# Patient Record
Sex: Female | Born: 1975 | Race: Black or African American | Hispanic: No | Marital: Single | Smoking: Never smoker
Health system: Southern US, Community
[De-identification: ages and names within clinical notes are randomized; demographics above are authoritative.]

## PROBLEM LIST (undated history)

## (undated) DIAGNOSIS — N3289 Other specified disorders of bladder: Secondary | ICD-10-CM

## (undated) DIAGNOSIS — N200 Calculus of kidney: Secondary | ICD-10-CM

## (undated) HISTORY — PX: OTHER SURGICAL HISTORY: SHX169

## (undated) HISTORY — PX: ANKLE SURGERY: SHX546

## (undated) HISTORY — PX: OVARIAN CYST REMOVAL: SHX89

## (undated) HISTORY — DX: Other specified disorders of bladder: N32.89

## (undated) HISTORY — DX: Calculus of kidney: N20.0

---

## 1998-04-29 ENCOUNTER — Encounter: Admission: RE | Admit: 1998-04-29 | Discharge: 1998-04-29 | Payer: Self-pay | Admitting: *Deleted

## 1998-12-27 ENCOUNTER — Ambulatory Visit (HOSPITAL_COMMUNITY): Admission: RE | Admit: 1998-12-27 | Discharge: 1998-12-27 | Payer: Self-pay | Admitting: Urology

## 2000-01-11 ENCOUNTER — Other Ambulatory Visit: Admission: RE | Admit: 2000-01-11 | Discharge: 2000-01-11 | Payer: Self-pay | Admitting: *Deleted

## 2000-08-15 ENCOUNTER — Inpatient Hospital Stay (HOSPITAL_COMMUNITY): Admission: AD | Admit: 2000-08-15 | Discharge: 2000-08-17 | Payer: Self-pay | Admitting: Obstetrics and Gynecology

## 2000-11-29 ENCOUNTER — Emergency Department (HOSPITAL_COMMUNITY): Admission: EM | Admit: 2000-11-29 | Discharge: 2000-11-29 | Payer: Self-pay | Admitting: Emergency Medicine

## 2001-04-22 ENCOUNTER — Emergency Department (HOSPITAL_COMMUNITY): Admission: EM | Admit: 2001-04-22 | Discharge: 2001-04-22 | Payer: Self-pay | Admitting: *Deleted

## 2001-05-03 ENCOUNTER — Emergency Department (HOSPITAL_COMMUNITY): Admission: EM | Admit: 2001-05-03 | Discharge: 2001-05-03 | Payer: Self-pay | Admitting: Emergency Medicine

## 2001-10-25 ENCOUNTER — Encounter: Payer: Self-pay | Admitting: Emergency Medicine

## 2001-10-25 ENCOUNTER — Emergency Department (HOSPITAL_COMMUNITY): Admission: EM | Admit: 2001-10-25 | Discharge: 2001-10-25 | Payer: Self-pay | Admitting: Emergency Medicine

## 2002-01-09 ENCOUNTER — Other Ambulatory Visit: Admission: RE | Admit: 2002-01-09 | Discharge: 2002-01-09 | Payer: Self-pay | Admitting: Obstetrics and Gynecology

## 2002-01-14 ENCOUNTER — Encounter: Admission: RE | Admit: 2002-01-14 | Discharge: 2002-01-14 | Payer: Self-pay | Admitting: Obstetrics and Gynecology

## 2002-01-14 ENCOUNTER — Encounter: Payer: Self-pay | Admitting: Obstetrics and Gynecology

## 2002-03-24 ENCOUNTER — Ambulatory Visit (HOSPITAL_BASED_OUTPATIENT_CLINIC_OR_DEPARTMENT_OTHER): Admission: RE | Admit: 2002-03-24 | Discharge: 2002-03-24 | Payer: Self-pay | Admitting: Urology

## 2002-03-24 ENCOUNTER — Encounter: Payer: Self-pay | Admitting: Urology

## 2002-03-28 ENCOUNTER — Encounter: Payer: Self-pay | Admitting: Urology

## 2002-03-28 ENCOUNTER — Encounter: Admission: RE | Admit: 2002-03-28 | Discharge: 2002-03-28 | Payer: Self-pay | Admitting: Urology

## 2002-04-08 ENCOUNTER — Encounter: Payer: Self-pay | Admitting: Urology

## 2002-04-08 ENCOUNTER — Encounter: Admission: RE | Admit: 2002-04-08 | Discharge: 2002-04-08 | Payer: Self-pay | Admitting: Urology

## 2004-10-04 ENCOUNTER — Other Ambulatory Visit: Admission: RE | Admit: 2004-10-04 | Discharge: 2004-10-04 | Payer: Self-pay | Admitting: *Deleted

## 2005-11-28 ENCOUNTER — Other Ambulatory Visit: Admission: RE | Admit: 2005-11-28 | Discharge: 2005-11-28 | Payer: Self-pay | Admitting: *Deleted

## 2006-04-20 ENCOUNTER — Encounter: Admission: RE | Admit: 2006-04-20 | Discharge: 2006-04-20 | Payer: Self-pay | Admitting: Gastroenterology

## 2006-10-02 DIAGNOSIS — N3289 Other specified disorders of bladder: Secondary | ICD-10-CM

## 2006-10-02 HISTORY — DX: Other specified disorders of bladder: N32.89

## 2006-11-16 ENCOUNTER — Encounter (INDEPENDENT_AMBULATORY_CARE_PROVIDER_SITE_OTHER): Payer: Self-pay | Admitting: *Deleted

## 2006-11-16 ENCOUNTER — Ambulatory Visit (HOSPITAL_BASED_OUTPATIENT_CLINIC_OR_DEPARTMENT_OTHER): Admission: RE | Admit: 2006-11-16 | Discharge: 2006-11-16 | Payer: Self-pay | Admitting: Urology

## 2007-02-06 ENCOUNTER — Inpatient Hospital Stay (HOSPITAL_COMMUNITY): Admission: AD | Admit: 2007-02-06 | Discharge: 2007-02-06 | Payer: Self-pay | Admitting: Obstetrics and Gynecology

## 2007-03-18 ENCOUNTER — Inpatient Hospital Stay (HOSPITAL_COMMUNITY): Admission: AD | Admit: 2007-03-18 | Discharge: 2007-03-18 | Payer: Self-pay | Admitting: Obstetrics and Gynecology

## 2007-08-20 ENCOUNTER — Inpatient Hospital Stay (HOSPITAL_COMMUNITY): Admission: AD | Admit: 2007-08-20 | Discharge: 2007-08-23 | Payer: Self-pay | Admitting: Obstetrics and Gynecology

## 2009-10-09 ENCOUNTER — Emergency Department (HOSPITAL_BASED_OUTPATIENT_CLINIC_OR_DEPARTMENT_OTHER): Admission: EM | Admit: 2009-10-09 | Discharge: 2009-10-09 | Payer: Self-pay | Admitting: Emergency Medicine

## 2010-04-09 ENCOUNTER — Ambulatory Visit: Payer: Self-pay | Admitting: Diagnostic Radiology

## 2010-04-09 ENCOUNTER — Emergency Department (HOSPITAL_BASED_OUTPATIENT_CLINIC_OR_DEPARTMENT_OTHER): Admission: EM | Admit: 2010-04-09 | Discharge: 2010-04-09 | Payer: Self-pay | Admitting: Emergency Medicine

## 2010-07-12 ENCOUNTER — Emergency Department (HOSPITAL_COMMUNITY): Admission: EM | Admit: 2010-07-12 | Discharge: 2010-07-12 | Payer: Self-pay | Admitting: Family Medicine

## 2010-12-14 LAB — POCT URINALYSIS DIPSTICK
Glucose, UA: NEGATIVE mg/dL
Hgb urine dipstick: NEGATIVE
Ketones, ur: >=160 mg/dL — AB
Nitrite: NEGATIVE
Specific Gravity, Urine: 1.02 (ref 1.005–1.030)
pH: 6 (ref 5.0–8.0)

## 2010-12-18 LAB — WET PREP, GENITAL
Trich, Wet Prep: NONE SEEN
Yeast Wet Prep HPF POC: NONE SEEN

## 2010-12-18 LAB — URINALYSIS, ROUTINE W REFLEX MICROSCOPIC
Glucose, UA: NEGATIVE mg/dL
Ketones, ur: NEGATIVE mg/dL
Nitrite: NEGATIVE
pH: 6.5 (ref 5.0–8.0)

## 2010-12-18 LAB — URINE MICROSCOPIC-ADD ON

## 2010-12-18 LAB — PREGNANCY, URINE: Preg Test, Ur: NEGATIVE

## 2011-02-14 NOTE — H&P (Signed)
NAMETECKLA, CHRISTIANSEN NO.:  [id number]   MEDICAL RECORD NO.:  [id number]          PATIENT TYPE:  INP   LOCATION:  9106                          FACILITY:  [facility]   PHYSICIAN:  Osborn Coho, M.D.   DATE OF BIRTH:  1975/10/04   DATE OF ADMISSION:  08/20/2007  DATE OF DISCHARGE:                              HISTORY & PHYSICAL   HISTORY OF PRESENT ILLNESS:  Ms. Colclasure is a 35 year old single black  female gravida 2, para 1-0-0-1 at 39-6/7 weeks who presents with  spontaneous rupture of membranes at 9:30 p.m. with irregular  contractions since that time.  She denies bleeding, headache, nausea and  vomiting or diarrhea.  Her pregnancy has been followed by the [facility] OB/GYN MD service and has been remarkable for:  1. Oligohydramnios with her first pregnancy.  2. History of Chlamydia.  3. Bladder surgery in February 2008 to dilate her urethra.  4. IBS and GERD.  5. History of kidney stones  6. MRSA in October 2007 with negative follow-up  7. Group B strep positive.   PRENATAL LABORATORIES:  Her prenatal labs were collected on January 13, 2007, hemoglobin 12.6, hematocrit 37.5, platelets 336,000.  Blood type  A+, antibody negative, sickle cell trait negative.  RPR nonreactive,  rubella immune, hepatitis B surface antigen negative, HIV nonreactive.  Cystic fibrosis negative.  Pap smear within normal limits.  Gonorrhea  negative, chlamydia negative.  One-hour Glucola from May 29, 2007,  was 116.  Culture of the vaginal tract for group B strep, gonorrhea and  chlamydia from July 10, 2007, gonorrhea negative and chlamydia were  negative.  Group B strep was positive.   HISTORY OF PRESENT PREGNANCY:  The patient presented for care at [facility] on January 13, 2007, at 9-6/[redacted] weeks gestation.  First trimester  screen was done at 13-1/[redacted] weeks gestation and was within normal limits.  Anatomy ultrasound at 18-6/[redacted] weeks gestation was normal and showed  growth  consistent with previous dating confirming [facility] of August 19, 2007.  EIF was seen, but all other anatomy was normal.  Ultrasound at [redacted]  weeks gestation, showed no evidence of EIF.  It showed a wet prep with  normal vaginal discharge at that time, showed a normal Glucola.  Rest  her prenatal care has been unremarkable.  She has remained low  normotensive with size equal to dates throughout.   OBSTETRICAL HISTORY:  She is a gravida 2, para 1-0-0-1.  In November  2001, she had a vacuum extraction vaginal delivery for a viable female  infant weighing 7 pounds at [redacted] weeks gestation after 13 hours of labor.  She had epidural for anesthesia.  She had oligohydramnios with that  pregnancy.  Infant's name is [name].  This pregnancy is a different  father of baby.  Medical history shows no medication allergies.  She  experienced menarche at the age of 23 with 28-day cycles lasting 4-5  days.  She has used condoms in the past for contraception.  In 2006, she  had a laparoscopy and D&C for ovarian cysts.  In  January 2006 she was  treated for chlamydia.  She reports having had the usual childhood  illnesses.  She had group B strep with her first pregnancy.  She has  history of I BS and acid reflux as well as hiatal hernia which she has  taken Aciphex for in the past.  She had cystitis in January 2008,  pyelonephritis in 2007.  She has a history of bladder ulcers.  She  fractured a finger in March 2008, fractured her left ankle in 1996.   SURGICAL HISTORY:  1. Ankle surgery 1996.  2. Bladder surgery in February 2008 where she had her urethra dilated.  3. Kidney stone removal in 2003.  4. Surgery for ovarian cyst in 2004.   FAMILY MEDICAL HISTORY:  Maternal grandmother with heart disease,  multiple family members with hypertension.  Son with asthma.  Maternal  grandmother with diabetes.  Maternal aunt and sister with thyroid  dysfunction.  Paternal grandmother with CVA.  Maternal aunt with   rheumatoid arthritis.   GENETIC HISTORY:  Remarkable for father of baby's sister with sickle  cell trait or disease.   SOCIAL HISTORY:  The patient is single.  Father of the baby's name is  Tommy.  The patient is of the [place] faith.  She had 14 years  education and is employed full-time as a Engineer, technical sales.  Father of  baby is high school educated and employed full-time in Scientist, water quality.  They deny any alcohol, tobacco or illicit drug use with the pregnancy.   OBJECTIVE:  VITAL SIGNS: Stable.  She is afebrile.  HEENT:  Grossly was within normal limits.  CHEST:  Clear to auscultation.  HEART:  Regular rate and rhythm.  ABDOMEN:  Gravid and contour with fundal height extending approximately  39 cm above pubic symphysis.  Fetal heart rate is reactive reassuring.  Contractions are irregular every 6-8 minutes.  Cervix was 3-4 cm  dilated, 80% effaced, vertex -2 with positive clear fluids on perineum  which is positive for ferning and positive for Nitrazine.  EXTREMITIES:  Normal.   ASSESSMENT:  1. Intrauterine pregnancy at term.  2. Spontaneous rupture of membranes.  3. Group B strep positive.   PLAN:  1. Admit to birthing suites.  2. Routine MD orders.  3. Plan expectant management for now with Pitocin at 6:00 a.m. if no      labor.  4. Penicillin G for group B strep prophylaxis.  5. Epidural as needed.      Cam Hai, C.N.M.      Osborn Coho, M.D.  Electronically Signed    KS/MEDQ  D:  08/21/2007  T:  08/21/2007  Job:  [id number]

## 2011-02-17 NOTE — Op Note (Signed)
[name], [name]              ACCOUNT NO.:  [id number]   MEDICAL RECORD NO.:  [id number]          PATIENT TYPE:  AMB   LOCATION:  [facility]                         FACILITY:  [facility]   PHYSICIAN:  Sigmund I. Patsi Sears, M.D.DATE OF BIRTH:  18-Jul-1976   DATE OF PROCEDURE:  11/16/2006  DATE OF DISCHARGE:                               OPERATIVE REPORT   PREOP DIAGNOSIS:  Interstitial cystitis.   POSTOP DIAGNOSIS:  Interstitial cystitis.   OPERATION:  1. Cystourethroscopy.  2. Hydrodistention of the bladder.  3. Biopsy of the bladder.  4. Cauterization of biopsy sites.  5. Installation of Pyridium Marcaine in the bladder.  6. Injection of Marcaine and Kenalog in the subtrigonal space.   SURGEON:  Sigmund I. Patsi Sears, M.D.   ANESTHESIA:  General LMA.   PREOPERATIVE PREPARATION:  After appropriate preanesthesia, the patient  is brought to the operating room, placed on the operating table in the  dorsal supine position where general LMA anesthesia was introduced.  She  was then replaced in the dorsal lithotomy position where the pubis was  prepped with Betadine solution and draped in the usual fashion.   REVIEW OF HISTORY:  This 35 year old female has history of chronic  cystitis, currently on a trimethoprim suppression therapy, and attempted  dietary changes to restrict caffeinated, carbonated drinks.  She is  status post cystoscopy in 2000, as well as a history kidney stones,  ovarian cyst surgery, laparoscopy in 2006, GERD and a heart murmur.  The patient is now for systolic to cystoscopy and hydrodistention.   PROCEDURE:  Cystourethroscopy was accomplished and shows normal-  appearing urethra with no evidence of stricture disease.  The bladder  base was normal, with clear efflux on both orifices.  There was no  evidence of bladder stone, tumor, or diverticular formation.  There was  evidence of cracking and bleeding of the bladder.  The bladder holds  approximately 400 mL of  water.   Following hydrodistention, Marcaine Pyridium is instilled bladder, and  then Marcaine and Kenalog was injected into the bladder base.  The  patient was awakened and taken to recovery room in good condition.      Sigmund I. Patsi Sears, M.D.  Electronically Signed     SIT/MEDQ  D:  11/16/2006  T:  11/16/2006  Job:  [id number]

## 2011-02-17 NOTE — Discharge Summary (Signed)
[facility]   Patient:    Sarah Stephens, Sarah Stephens                     MRN: [id number] Adm. Date:  [id number] Disc. Date: 08/17/00 Attending:  Pleas Koch Dictator:   Vance Gather Duplantis, C.N.M.                           Discharge Summary  ADMISSION DIAGNOSES:          1. Intrauterine pregnancy at term.                               2. Early labor.                               3. Spontaneous rupture of the membranes.                               4. Positive group B streptococcal.  DISCHARGE DIAGNOSES:          1. Intrauterine pregnancy at term.                               2. Early labor.                               3. Spontaneous rupture of the membranes.                               4. Positive group B streptococcal.                               5. Second-stage bradycardia.                               6. Status post vacuum-assisted vaginal delivery.                               7. Breast-feeding and desires oral                                  contraceptives.  PROCEDURES THIS ADMISSION:    Vacuum-assisted vaginal delivery of a viable female infant named [name] who had Apgars of 9 and 9 and weighed 7 pounds 0 ounces on August 15, 2000 attended by Dr. Leonard Schwartz.  HOSPITAL COURSE:              Ms. Gatliff is a 35 year old black female, gravida 1, para 0, at 39-6/7ths weeks who presents complaining of leaking clear fluid and regular uterine contractions.  She progressed well in labor to complete and pushing and developed second-stage bradycardia for which she was recommended to have a vacuum-assisted vaginal delivery to expedite delivery. She agreed and underwent the same for delivery of a viable female infant named [name] who weighed 7 pounds 0 ounces and had Apgars of 9 and 9 attended by Dr.  Leonard Schwartz.  Please see delivery note for details. Postpartally, she has done well.  She is ambulating, voiding, and  eating without difficulty.  Her vital signs are stable and she is afebrile.  She is breast-feeding without difficulty and desires oral contraceptives for birth control.  She is deemed ready for discharge today.  DISCHARGE INSTRUCTIONS:       Per the [facility].  DISCHARGE MEDICATIONS:        1. Motrin 600 mg p.o. q.6h. p.r.n. for pain.                               2. Tylox one to two p.o. q.4-6h. p.r.n. for                                  pain.                               3. Micronor one p.o. q.d. to start in two weeks.  DISCHARGE LABORATORIES:       Her hemoglobin is 12.1, WBC count is 12.5, and her platelets are 247.  DISCHARGE FOLLOW-UP:          Six weeks at [facility] or p.r.n. DD:  08/17/00 TD:  08/17/00 Job: [id number]

## 2011-02-17 NOTE — Op Note (Signed)
[facility]  Patient:    Sarah Stephens, Sarah Stephens                     MRN: [id number] Proc. Date: 08/15/00 Adm. Date:  [id number] Attending:  Pleas Koch                           Operative Report  PREOPERATIVE DIAGNOSES:       1. Term intrauterine pregnancy.                               2. Second stage bradycardia.  POSTOPERATIVE DIAGNOSES:      1. Term intrauterine pregnancy.                               2. Second stage bradycardia.  OPERATION:                    Vacuum extraction of vaginal delivery with repair of second degree midline laceration and repair of second degree right vaginal laceration.  SURGEON:                      Janine Limbo, M.D.  ANESTHESIA:                   Epidural.  INDICATIONS:                  Sarah Stephens is a 35 year old female, gravida 1, para 0, who presents in active labor.  She progressed to 10 cm and she began to push.  She began to have late decelerations associated with her contractions and her pushing.  An amnio-infusion was performed.  Fluid and oxygen were given.  We discussed our options for management which included observation only, continued pushing, operative vaginal delivery, and cesarean delivery.  The risks and benefits of each of those options were discussed. The patient, her husband, and her mother decided to proceed with operative vaginal delivery.  We discussed the risks and benefits of vacuum extraction and deliver versus forceps vaginal delivery.  The patient elected to proceed with vacuum extraction and vaginal delivery.  The specific risks were reviewed including the risk of caput formation, hematoma formation, the rare risk of intracranial bleeding and brain damage, and the possibility that a cesarean delivery may still be needed.  FINDINGS:                     The weight of the infant is currently not known. A female infant was delivered Sarah Stephens) from a slightly right occiput  posterior and transverse presentation.  The Apgars were 9 at one minute and 9 at five minutes.  There was a second degree midline laceration, slightly to the left of the midline.  There was also a second degree right vaginal laceration at the level of the introitus.  These were repaired using 2-0 Vicryl.  The upper vagina and cervix were within normal limits.  The placenta had extra lobe. There was a three vessel core present.  DESCRIPTION OF PROCEDURE:     The patient was placed in a lithotomy presentation.  The perineum and vagina were prepped with multiple nodes of Betadine.  A Foley catheter had previously been placed.  The patient was then sterilely draped.  A Kiwi  vacuum extractor was placed on the fetal head.  The cervix was 10 cm dilated and completely effaced.  The presenting part was at a +3 station and could be seen at the introitus without spreading the labia. The patient was able to push the fetal head through the introitus with the aid of the Kiwi vacuum extractor.  The mouth and nose were suctioned. The remainder of the infant was then delivered.  The cord was clamped and cut. The infant was placed on the mothers abdomen, where the infant was dry. Routine cord blood studies were obtained.  The placenta was removed. The upper vagina was inspected and there were no lacerations present.  The previously mentioned lacerations were then repaired, using interrupted sutures of 2-0 Vicryl.  Hemostasis was noted to be adequate.  The patient was then returned to the supine position. DD:  08/15/00 TD:  08/15/00 Job: [id number]
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
## 2011-03-02 ENCOUNTER — Other Ambulatory Visit: Payer: Self-pay | Admitting: Obstetrics and Gynecology

## 2011-07-11 LAB — CBC
HCT: 39.2
HCT: 39.3
Hemoglobin: 13.2
Platelets: 336
RBC: 4.77
RBC: 4.84
WBC: 12.9 — ABNORMAL HIGH

## 2011-07-11 LAB — RPR: RPR Ser Ql: NONREACTIVE

## 2011-07-19 LAB — URINALYSIS, ROUTINE W REFLEX MICROSCOPIC
Bilirubin Urine: NEGATIVE
Glucose, UA: NEGATIVE
Hgb urine dipstick: NEGATIVE
Specific Gravity, Urine: 1.015

## 2011-07-19 LAB — GC/CHLAMYDIA PROBE AMP, GENITAL
Chlamydia, DNA Probe: NEGATIVE
GC Probe Amp, Genital: NEGATIVE

## 2011-07-19 LAB — WET PREP, GENITAL
Clue Cells Wet Prep HPF POC: NONE SEEN
Trich, Wet Prep: NONE SEEN
Yeast Wet Prep HPF POC: NONE SEEN

## 2012-10-27 ENCOUNTER — Emergency Department (HOSPITAL_COMMUNITY)
Admission: EM | Admit: 2012-10-27 | Discharge: 2012-10-27 | Disposition: A | Discharge status: Home / Self Care | Payer: 59 | Attending: Emergency Medicine | Admitting: Emergency Medicine

## 2012-10-27 ENCOUNTER — Encounter (HOSPITAL_COMMUNITY): Payer: Self-pay | Admitting: Emergency Medicine

## 2012-10-27 ENCOUNTER — Emergency Department (HOSPITAL_COMMUNITY): Payer: 59

## 2012-10-27 DIAGNOSIS — Z87448 Personal history of other diseases of urinary system: Secondary | ICD-10-CM | POA: Insufficient documentation

## 2012-10-27 DIAGNOSIS — J680 Bronchitis and pneumonitis due to chemicals, gases, fumes and vapors: Secondary | ICD-10-CM | POA: Insufficient documentation

## 2012-10-27 DIAGNOSIS — Z79899 Other long term (current) drug therapy: Secondary | ICD-10-CM | POA: Insufficient documentation

## 2012-10-27 DIAGNOSIS — R490 Dysphonia: Secondary | ICD-10-CM | POA: Insufficient documentation

## 2012-10-27 DIAGNOSIS — R059 Cough, unspecified: Secondary | ICD-10-CM | POA: Insufficient documentation

## 2012-10-27 DIAGNOSIS — R51 Headache: Secondary | ICD-10-CM | POA: Insufficient documentation

## 2012-10-27 DIAGNOSIS — R05 Cough: Secondary | ICD-10-CM | POA: Insufficient documentation

## 2012-10-27 MED ORDER — ACETAMINOPHEN 325 MG PO TABS
650.0000 mg | ORAL_TABLET | Freq: Once | ORAL | Status: AC
  Administered 2012-10-27: 650 mg via ORAL
  Filled 2012-10-27: qty 2

## 2012-10-27 MED ORDER — ALBUTEROL SULFATE (5 MG/ML) 0.5% IN NEBU
5.0000 mg | INHALATION_SOLUTION | Freq: Once | RESPIRATORY_TRACT | Status: AC
  Administered 2012-10-27: 5 mg via RESPIRATORY_TRACT
  Filled 2012-10-27: qty 1

## 2012-10-27 MED ORDER — IPRATROPIUM BROMIDE 0.02 % IN SOLN
0.5000 mg | Freq: Once | RESPIRATORY_TRACT | Status: AC
  Administered 2012-10-27: 0.5 mg via RESPIRATORY_TRACT
  Filled 2012-10-27: qty 2.5

## 2012-10-27 NOTE — ED Provider Notes (Signed)
History   This chart was scribed for No att. providers found by Leone Payor, ED Scribe. This patient was seen in room APA06/APA06 and the patient's care was started 12:10 AM.   CSN: [id number]  Arrival date & time 10/27/12  1642   First MD Initiated Contact with Patient 10/27/12 1745      Chief Complaint  Patient presents with  . Allergic Reaction     The history is provided by the patient. No language interpreter was used.    Sarah Stephens is a 37 y.o. female who presents to the Emergency Department complaining of a new, sudden allergic reaction starting 5.5 hours ago. Pt reports she was cleaning when she mixed Clorox and toilet bowel cleaner and inhaled the resulting fumes which immediately took her breath away. Pt has associated hoarseness, chest pain, cough, and headache. She denies fever or vomiting. Pt works as a Scientist, research (life sciences).   Pt has h/o renal disorder.  Pt denies smoking but occasionally uses alcohol.  Past Medical History  Diagnosis Date  . Renal disorder     Past Surgical History  Procedure Date  . Ankle surgery   . Ovarian cyst removal     No family history on file.  History  Substance Use Topics  . Smoking status: Never Smoker   . Smokeless tobacco: Not on file  . Alcohol Use: Yes    No OB history provided.   Review of Systems  A complete 10 system review of systems was obtained and all systems are negative except as noted in the HPI and PMH.    Allergies  Review of patient's allergies indicates no known allergies.  Home Medications   Current Outpatient Rx  Name  Route  Sig  Dispense  Refill  . NORGESTIMATE-ETH ESTRADIOL 0.25-35 MG-MCG PO TABS   Oral   Take 1 tablet by mouth daily.           BP 135/82  Pulse 90  Temp 98 F (36.7 C) (Oral)  Resp 20  Ht 5\' 5"  (1.651 m)  Wt 186 lb (84.369 kg)  BMI 30.95 kg/m2  SpO2 100%  LMP 10/06/2012  Physical Exam  Nursing note and vitals reviewed. Constitutional: She is oriented  to person, place, and time. She appears well-developed and well-nourished.  HENT:  Head: Normocephalic and atraumatic.  Eyes: Conjunctivae normal and EOM are normal. Pupils are equal, round, and reactive to light.  Neck: Normal range of motion and phonation normal. Neck supple.  Cardiovascular: Normal rate, regular rhythm and intact distal pulses.   Pulmonary/Chest: Effort normal and breath sounds normal. She exhibits no tenderness.       Mild decreased air movement bilaterally. Scattered rhonchi. No wheezes or rales.   Abdominal: Soft. She exhibits no distension. There is no tenderness. There is no guarding.  Musculoskeletal: Normal range of motion.  Neurological: She is alert and oriented to person, place, and time. She has normal strength. She exhibits normal muscle tone.  Skin: Skin is warm and dry.  Psychiatric: She has a normal mood and affect. Her behavior is normal. Judgment and thought content normal.    ED Course  Procedures (including critical care time)  DIAGNOSTIC STUDIES: Oxygen Saturation is 100% on room air, normal by my interpretation.    COORDINATION OF CARE:  6:06 PM Discussed treatment plan which includes breathing treatment and tylenol with pt at bedside and pt agreed to plan.     Dg Chest 2 View  10/27/2012  *RADIOLOGY  REPORT*  Clinical Data: Shortness of breath and cough.  CHEST - 2 VIEW  Comparison: None.  Findings: Lungs are clear.  Heart size normal.  No pneumothorax or pleural fluid.  IMPRESSION: Negative chest.   Original Report Authenticated By: Holley Dexter, M.D.    Nursing notes, applicable records and vitals reviewed.  Radiologic Images/Reports reviewed.   1. Acute chemical bronchitis       MDM  Mild chemical bronchitis; with improving. Symptoms. Patient does not have chronic respiratory issues. She does not need ongoing treatment. She is stable for discharge      I personally performed the services described in this documentation,  which was scribed in my presence. The recorded information has been reviewed and is accurate.     Plan: Home Medications- symptomatic; Home Treatments- fluids Recommended follow up- PCP prn    Flint Melter, MD 10/28/12 0011

## 2012-10-27 NOTE — ED Notes (Signed)
Patient states her chest hurts when she takes a deep breath or coughs.

## 2012-10-27 NOTE — Discharge Instructions (Signed)
Drink plenty of fluids. Take Tylenol for pain. Return here, if needed, for problems.    Bronchitis Bronchitis is a problem of the air tubes leading to your lungs. This problem makes it hard for air to get in and out of the lungs. You may cough a lot because your air tubes are narrow. Going without care can cause lasting (chronic) bronchitis. HOME CARE   Drink enough fluids to keep your pee (urine) clear or pale yellow.  Use a cool mist humidifier.  Quit smoking if you smoke. If you keep smoking, the bronchitis might not get better.  Only take medicine as told by your doctor. GET HELP RIGHT AWAY IF:   Coughing keeps you awake.  You start to wheeze.  You become more sick or weak.  You have a hard time breathing or get short of breath.  You cough up blood.  Coughing lasts more than 2 weeks.  You have a fever.  Your baby is older than 3 months with a rectal temperature of 102 F (38.9 C) or higher.  Your baby is 5 months old or younger with a rectal temperature of 100.4 F (38 C) or higher. MAKE SURE YOU:  Understand these instructions.  Will watch your condition.  Will get help right away if you are not doing well or get worse. Document Released: 03/06/2008 Document Revised: 12/11/2011 Document Reviewed: 08/20/2009 [facility] Patient Information 2013 [place], [place].

## 2012-10-27 NOTE — ED Notes (Signed)
Pt presents with c/o chest discomfort and cough secondary to breathing fumes after mixing bleach and toilet cleaner together about 2 hrs ago. Pt has drank "a lot of water and been outside for fresh air without relief". Pt has a hoarse cough and reports feeling like she needs to clear her throat. Denies SOB at this time.

## 2012-10-27 NOTE — ED Notes (Signed)
Patient states around 1230 she was cleaning and mixed clorox with a cleaning agent and it immediately took her breath.  Patient c/o hoarseness and chest pain.

## 2013-03-20 ENCOUNTER — Other Ambulatory Visit: Payer: Self-pay | Admitting: Obstetrics and Gynecology

## 2013-11-11 ENCOUNTER — Encounter: Payer: Self-pay | Admitting: Gastroenterology

## 2013-12-12 ENCOUNTER — Ambulatory Visit: Payer: 59 | Admitting: Gastroenterology

## 2014-01-20 ENCOUNTER — Encounter: Payer: Self-pay | Admitting: Gastroenterology

## 2014-01-20 ENCOUNTER — Ambulatory Visit (INDEPENDENT_AMBULATORY_CARE_PROVIDER_SITE_OTHER): Payer: 59 | Admitting: Gastroenterology

## 2014-01-20 VITALS — BP 124/86 | HR 80 | Ht 65.0 in | Wt 193.2 lb

## 2014-01-20 DIAGNOSIS — K219 Gastro-esophageal reflux disease without esophagitis: Secondary | ICD-10-CM

## 2014-01-20 DIAGNOSIS — R1310 Dysphagia, unspecified: Secondary | ICD-10-CM | POA: Insufficient documentation

## 2014-01-20 MED ORDER — PANTOPRAZOLE SODIUM 40 MG PO TBEC: 40.0000 mg | DELAYED_RELEASE_TABLET | Freq: Every day | ORAL | Status: DC

## 2014-01-20 NOTE — Assessment & Plan Note (Signed)
Patient has classic reflux symptoms  Recommendations #1 antireflux measures #2 begin Protonix 40 mg daily

## 2014-01-20 NOTE — Patient Instructions (Signed)
Your prescription will be sent to your pharmacy  Follow up in 6 weeks Diet for Gastroesophageal Reflux Disease, Adult Reflux (acid reflux) is when acid from your stomach flows up into the esophagus. When acid comes in contact with the esophagus, the acid causes irritation and soreness (inflammation) in the esophagus. When reflux happens often or so severely that it causes damage to the esophagus, it is called gastroesophageal reflux disease (GERD). Nutrition therapy can help ease the discomfort of GERD. FOODS OR DRINKS TO AVOID OR LIMIT  Smoking or chewing tobacco. Nicotine is one of the most potent stimulants to acid production in the gastrointestinal tract.  Caffeinated and decaffeinated coffee and black tea.  Regular or low-calorie carbonated beverages or energy drinks (caffeine-free carbonated beverages are allowed).   Strong spices, such as black pepper, white pepper, red pepper, cayenne, curry powder, and chili powder.  Peppermint or spearmint.  Chocolate.  High-fat foods, including meats and fried foods. Extra added fats including oils, butter, salad dressings, and nuts. Limit these to less than 8 tsp per day.  Fruits and vegetables if they are not tolerated, such as citrus fruits or tomatoes.  Alcohol.  Any food that seems to aggravate your condition. If you have questions regarding your diet, call your caregiver or a registered dietitian. OTHER THINGS THAT MAY HELP GERD INCLUDE:   Eating your meals slowly, in a relaxed setting.  Eating 5 to 6 small meals per day instead of 3 large meals.  Eliminating food for a period of time if it causes distress.  Not lying down until 3 hours after eating a meal.  Keeping the head of your bed raised 6 to 9 inches (15 to 23 cm) by using a foam wedge or blocks under the legs of the bed. Lying flat may make symptoms worse.  Being physically active. Weight loss may be helpful in reducing reflux in overweight or obese adults.  Wear  loose fitting clothing EXAMPLE MEAL PLAN This meal plan is approximately 2,000 calories based on https://www.bernard.org/ChooseMyPlate.gov meal planning guidelines. Breakfast   cup cooked oatmeal.  1 cup strawberries.  1 cup low-fat milk.  1 oz almonds. Snack  1 cup cucumber slices.  6 oz yogurt (made from low-fat or fat-free milk). Lunch  2 slice whole-wheat bread.  2 oz sliced [place].  2 tsp mayonnaise.  1 cup blueberries.  1 cup snap peas. Snack  6 whole-wheat crackers.  1 oz string cheese. Dinner   cup brown rice.  1 cup mixed veggies.  1 tsp olive oil.  3 oz grilled fish. Document Released: 09/18/2005 Document Revised: 12/11/2011 Document Reviewed: 08/04/2011 [facility] Patient Information 2014 [place], [place].

## 2014-01-20 NOTE — Assessment & Plan Note (Signed)
Dysphagia may be do to a fixed stricture.  Alternatively, she may have subtle inflammation causing the symptoms.  Recommendations #1 trial of Protonix 40 mg daily.  If, after 2 weeks, she still has dysphagia I will proceed with upper endoscopy and dilate as indicated

## 2014-01-20 NOTE — Progress Notes (Signed)
    _                                                                                                                History of Present Illness: Pleasant 38 year old female referred for evaluation of reflux.  For about a year she's been having very frequent pyrosis consisting of burning chest discomfort especially after drinking carbonated beverages.  In the last month she's been complaining of dysphagia to solids.  She takes ibuprofen several times a week.  She denies abdominal pain.  She's tried some over-the-counter medications with only partial relief.    Past Medical History  Diagnosis Date  . Renal disorder    Past Surgical History  Procedure Laterality Date  . Ankle surgery    . Ovarian cyst removal     family history includes Diabetes in her maternal grandmother; Heart disease in her maternal grandmother. Current Outpatient Prescriptions  Medication Sig Dispense Refill  . norgestimate-ethinyl estradiol (ORTHO-CYCLEN,SPRINTEC,PREVIFEM) 0.25-35 MG-MCG tablet Take 1 tablet by mouth daily.       No current facility-administered medications for this visit.   Allergies as of 01/20/2014  . (No Known Allergies)    reports that she has never smoked. She has never used smokeless tobacco. She reports that she does not drink alcohol or use illicit drugs.     Review of Systems: Pertinent positive and negative review of systems were noted in the above HPI section. All other review of systems were otherwise negative.  Vital signs were reviewed in today's medical record Physical Exam: General: Well developed , well nourished, no acute distress Skin: anicteric Head: Normocephalic and atraumatic Eyes:  sclerae anicteric, EOMI Ears: Normal auditory acuity Mouth: No deformity or lesions Neck: Supple, no masses or thyromegaly Lungs: Clear throughout to auscultation Heart: Regular rate and rhythm; no murmurs, rubs or bruits Abdomen: Soft, non tender and non distended. No  masses, hepatosplenomegaly or hernias noted. Normal Bowel sounds Rectal:deferred Musculoskeletal: Symmetrical with no gross deformities  Skin: No lesions on visible extremities Pulses:  Normal pulses noted Extremities: No clubbing, cyanosis, edema or deformities noted Neurological: Alert oriented x 4, grossly nonfocal Cervical Nodes:  No significant cervical adenopathy Inguinal Nodes: No significant inguinal adenopathy Psychological:  Alert and cooperative. Normal mood and affect  See Assessment and Plan under Problem List

## 2014-01-31 ENCOUNTER — Ambulatory Visit (INDEPENDENT_AMBULATORY_CARE_PROVIDER_SITE_OTHER): Payer: 59 | Admitting: Internal Medicine

## 2014-01-31 VITALS — BP 124/86 | HR 77 | Temp 98.3°F | Resp 16 | Ht 65.5 in | Wt 191.0 lb

## 2014-01-31 DIAGNOSIS — R350 Frequency of micturition: Secondary | ICD-10-CM

## 2014-01-31 DIAGNOSIS — F411 Generalized anxiety disorder: Secondary | ICD-10-CM

## 2014-01-31 DIAGNOSIS — K589 Irritable bowel syndrome without diarrhea: Secondary | ICD-10-CM

## 2014-01-31 DIAGNOSIS — R1032 Left lower quadrant pain: Secondary | ICD-10-CM

## 2014-01-31 DIAGNOSIS — Z6831 Body mass index (BMI) 31.0-31.9, adult: Secondary | ICD-10-CM | POA: Insufficient documentation

## 2014-01-31 LAB — POCT URINALYSIS DIPSTICK
BILIRUBIN UA: NEGATIVE
Blood, UA: NEGATIVE
Glucose, UA: NEGATIVE
KETONES UA: NEGATIVE
LEUKOCYTES UA: NEGATIVE
NITRITE UA: NEGATIVE
Protein, UA: 30
Spec Grav, UA: 1.025
Urobilinogen, UA: 0.2
pH, UA: 5

## 2014-01-31 LAB — POCT UA - MICROSCOPIC ONLY
Amorphous: POSITIVE
Casts, Ur, LPF, POC: NEGATIVE
Crystals, Ur, HPF, POC: NEGATIVE
YEAST UA: NEGATIVE

## 2014-01-31 MED ORDER — SULFAMETHOXAZOLE-TMP DS 800-160 MG PO TABS: 1.0000 | ORAL_TABLET | Freq: Two times a day (BID) | ORAL | Status: DC

## 2014-01-31 MED ORDER — DICYCLOMINE HCL 20 MG PO TABS: 20.0000 mg | ORAL_TABLET | Freq: Three times a day (TID) | ORAL | Status: DC

## 2014-01-31 NOTE — Progress Notes (Signed)
Subjective:    Patient ID: Sarah Stephens, female    DOB: July 30, 1976, 38 y.o.   MRN: [id number]  HPI1st umfc ov Concerned about urinary frequency with suprapubic pressure and LLQ abd pain that started 3 d ago No fever or chills No nausea or vomiting She has had diarrhea several times in the last 3 or 4 days but this is common for her/she has a history of diarrhea during times of anxiety dating back to middle school but most particularly over the last 2-3 years She has some insomnia secondary to anxiety and worries frequently about  typical events in life flight to much work to do, too many bills, relationship disagreements etc.///Never treated for any of this Now has a 36 year old who is also creating worriesfor her  lmp 2w ago wnl 1 long-term sexual partner no recent intercourse/no vaginal discharge  Review of Systems  Constitutional: Negative for activity change, fatigue and unexpected weight change.  Eyes: Negative for visual disturbance.  Respiratory: Negative for chest tightness and shortness of breath.   Cardiovascular: Negative for chest pain, palpitations and leg swelling.  Gastrointestinal: Negative for constipation and anal bleeding.       Note recent evaluation gastroenterology -starting Protonix 2 weeks ago. At this point has not been helpful to stop the mid esophageal discomfort, fullness, dysphagia  Genitourinary: Negative for menstrual problem, pelvic pain and dyspareunia.  Musculoskeletal: Negative for back pain.  Psychiatric/Behavioral: Negative for behavioral problems and self-injury.       Objective:   Physical Exam BP 124/86  Pulse 77  Temp(Src) 98.3 F (36.8 C) (Oral)  Resp 16  Ht 5' 5.5" (1.664 m)  Wt 191 lb (86.637 kg)  BMI 31.29 kg/m2  SpO2 98%  LMP 01/15/2014 HEENT clear without thyromegaly Heart regular Abdomen soft non-distended/no hepatosplenomegaly Mildly tender in the left lower quadrant without rebound Mildly tender suprapubic No CVA  tenderness to percussion Mood is good/affect appropriate/discusses anxiety easily  Results for orders placed in visit on 01/31/14  POCT URINALYSIS DIPSTICK      Result Value Ref Range   Color, UA yellow     Clarity, UA clear     Glucose, UA neg     Bilirubin, UA neg     Ketones, UA neg     Spec Grav, UA 1.025     Blood, UA neg     pH, UA 5.0     Protein, UA 30     Urobilinogen, UA 0.2     Nitrite, UA neg     Leukocytes, UA Negative    POCT UA - MICROSCOPIC ONLY      Result Value Ref Range   WBC, Ur, HPF, POC 1-5     RBC, urine, microscopic 2-3     Bacteria, U Microscopic 3+     Mucus, UA pod     Epithelial cells, urine per micros 0-1     Crystals, Ur, HPF, POC neg     Casts, Ur, LPF, POC neg     Yeast, UA neg     Amorphous pos          Assessment & Plan:  Urinary frequency - Plan:  Urine culture//sxt-ds Meds ordered this encounter  Medications  . sulfamethoxazole-trimethoprim (BACTRIM DS) 800-160 MG per tablet    Sig: Take 1 tablet by mouth 2 (two) times daily.   LLQ pain--recent diarrhea IBS (irritable bowel syndrome) . dicyclomine (BENTYL) 20 MG tablet trial prior to f/u w/ GI   GAD (generalized  anxiety disorder)  Referred to our behavioral health-[facility]

## 2014-02-01 LAB — URINE CULTURE
COLONY COUNT: NO GROWTH
ORGANISM ID, BACTERIA: NO GROWTH

## 2014-02-04 ENCOUNTER — Telehealth: Payer: Self-pay

## 2014-02-04 NOTE — Telephone Encounter (Signed)
PT WOULD LIKE TO HAVE SOME PHENATRAME CALLED IN TO HELP HER LOSE WEIGHT. PLEASE CALL [phone]    [facility] [address]

## 2014-02-04 NOTE — Telephone Encounter (Signed)
Dr. Merla Richesoolittle, do you prescribe this or would she have to referred somewhere else.

## 2014-02-09 NOTE — Telephone Encounter (Signed)
Pt.notified

## 2014-02-09 NOTE — Telephone Encounter (Signed)
Not a medicine that she should use

## 2014-02-16 ENCOUNTER — Telehealth: Payer: Self-pay | Admitting: Gastroenterology

## 2014-02-16 NOTE — Telephone Encounter (Signed)
Pt states she is still having problems with epigastric pain and difficulty swallowing. Pt states the medication has helped a little but not much. Pt scheduled to see Willette ClusterPaula Guenther NP tomorrow at 3:30pm. Pt aware of appt.

## 2014-02-17 ENCOUNTER — Other Ambulatory Visit (INDEPENDENT_AMBULATORY_CARE_PROVIDER_SITE_OTHER): Payer: 59

## 2014-02-17 ENCOUNTER — Ambulatory Visit (INDEPENDENT_AMBULATORY_CARE_PROVIDER_SITE_OTHER): Payer: 59 | Admitting: Nurse Practitioner

## 2014-02-17 ENCOUNTER — Encounter: Payer: Self-pay | Admitting: Nurse Practitioner

## 2014-02-17 VITALS — BP 120/74 | HR 66 | Ht 65.5 in | Wt 194.6 lb

## 2014-02-17 DIAGNOSIS — R635 Abnormal weight gain: Secondary | ICD-10-CM

## 2014-02-17 DIAGNOSIS — R1012 Left upper quadrant pain: Secondary | ICD-10-CM

## 2014-02-17 LAB — TSH: TSH: 1.51 u[IU]/mL (ref 0.35–4.50)

## 2014-02-17 NOTE — Progress Notes (Signed)
     History of Present Illness:   Patient is a 38 year old female evaluated by Dr. Arlyce DiceKaplan a few weeks ago for GERD and dysphagia. She was started on daily PPI and both both problems have improved. Patient comes in today for LUQ pain. She complains of a "knot" in LUQ, discovered a week or so after her 01/20/14 visit here. She feels the knot best when standing upright. The LUQ discomfort is not related to eating. No associated nausea. Patient gives a life long history of IBS type symptoms. Anxiety / stressful events often lead to loose stools.    Current Medications, Allergies, Past Medical History, Past Surgical History, Family History and Social History were [facility].  Physical Exam: General: Pleasant, well developed female in no acute distress Head: Normocephalic and atraumatic Eyes:  sclerae anicteric, conjunctiva pink  Ears: Normal auditory acuity Lungs: Clear throughout to auscultation Heart: Regular rate and rhythm Abdomen: Soft, protuberant but non distended, non-tender. I could not appreciate a knot with her in lying or standing position.  Normal bowel sounds Musculoskeletal: Symmetrical with no gross deformities  Extremities: No edema  Neurological: Alert oriented x 4, grossly nonfocal Psychological:  Alert and cooperative. Normal mood and affect  Assessment and Recommendations:  511. 38 year old female with longstanding history of GERD. Recently started on PPI for pyrosis. Symptoms improving. Continue daily PPI until next follow up visit here. Anti-reflux measures discussed. She is already trying to follow them.   2. Dysphagia, also improved after starting PPI. This may have been secondary to esophagitis secondary to GERD. Continue daily PPI for now.  3. LUQ discomfort with "knot". She mainly feels this LUQ knot when standing. I couldn't appreciate a knot or any bulges in lying or standing position. Suppose she could have an abdominal  wall hernia. I don't think this is intraabdominal in nature. Advised patient to follow up with PCP unless the pain becomes more GI in nature (related to meals, associated with nausea, etc...)

## 2014-02-17 NOTE — Patient Instructions (Signed)
Please go to the basement level to have your labs drawn.  Continue the Protonix 40 mg , 1 tab daily.  We made you a follow up appointment with Dr. Arlyce DiceKaplan on 04-20-2014 at 1:45 PM.

## 2014-02-18 NOTE — Progress Notes (Signed)
Reviewed and agree with management. Robert D. Kaplan, M.D., FACG  

## 2014-03-16 ENCOUNTER — Ambulatory Visit: Payer: 59 | Admitting: Gastroenterology

## 2014-04-06 ENCOUNTER — Ambulatory Visit (INDEPENDENT_AMBULATORY_CARE_PROVIDER_SITE_OTHER): Payer: 59 | Admitting: Emergency Medicine

## 2014-04-06 VITALS — BP 124/86 | HR 88 | Temp 97.7°F | Resp 16 | Ht 65.25 in | Wt 190.0 lb

## 2014-04-06 DIAGNOSIS — A088 Other specified intestinal infections: Secondary | ICD-10-CM

## 2014-04-06 DIAGNOSIS — R112 Nausea with vomiting, unspecified: Secondary | ICD-10-CM

## 2014-04-06 MED ORDER — ONDANSETRON 4 MG PO TBDP
4.0000 mg | ORAL_TABLET | Freq: Once | ORAL | Status: AC
  Administered 2014-04-06: 4 mg via ORAL

## 2014-04-06 MED ORDER — ONDANSETRON 8 MG PO TBDP: 8.0000 mg | ORAL_TABLET | Freq: Three times a day (TID) | ORAL | Status: DC | PRN

## 2014-04-06 MED ORDER — LOPERAMIDE HCL 2 MG PO TABS: ORAL_TABLET | ORAL | Status: DC

## 2014-04-06 NOTE — Progress Notes (Signed)
[facility] [address], [place] [id number] [phone]  Date:  04/06/2014   Name:  Sarah Stephens   DOB:  [birth year]   MRN:  [id number]  PCP:  Coralee RudURAN,MICHAEL R, PA-C    Chief Complaint: Nausea, Diarrhea and Fatigue   History of Present Illness:  Sarah Stephens is a 38 y.o. very pleasant female patient who presents with the following:  Ill since last night with nausea.  Vomited twice.  No fever but chilled.  Frequent loose stools over night and today.  No rash  The patient has no complaint of blood, mucous, or pus in her stools. No ill contacts.  No improvement with over the counter medications or other home remedies. Denies other complaint or health concern today.   Patient Active Problem List   Diagnosis Date Noted  . LUQ pain 02/17/2014  . IBS (irritable bowel syndrome) 01/31/2014  . GAD (generalized anxiety disorder) 01/31/2014  . BMI 31.0-31.9,adult 01/31/2014  . Esophageal reflux 01/20/2014  . Dysphagia, unspecified(787.20) 01/20/2014    Past Medical History  Diagnosis Date  . Kidney stones   . Urinary bladder ulcer 2008    Past Surgical History  Procedure Laterality Date  . Ankle surgery Left   . Ovarian cyst removal    . Lithotripy      for kidney stones  . Bladder stem stretched      x 2    History  Substance Use Topics  . Smoking status: Never Smoker   . Smokeless tobacco: Never Used  . Alcohol Use: No    Family History  Problem Relation Age of Onset  . Diabetes Maternal Grandmother   . Heart disease Maternal Grandmother   . Colon cancer Neg Hx     No Known Allergies  Medication list has been reviewed and updated.  Current Outpatient Prescriptions on File Prior to Visit  Medication Sig Dispense Refill  . norgestimate-ethinyl estradiol (ORTHO-CYCLEN,SPRINTEC,PREVIFEM) 0.25-35 MG-MCG tablet Take 1 tablet by mouth daily.      . pantoprazole (PROTONIX) 40 MG tablet Take 1 tablet (40 mg total) by mouth daily.  30 tablet   3   No current facility-administered medications on file prior to visit.    Review of Systems:  As per HPI, otherwise negative.    Physical Examination: Filed Vitals:   04/06/14 1327  BP: 124/86  Pulse: 88  Temp: 97.7 F (36.5 C)  Resp: 16   Filed Vitals:   04/06/14 1327  Height: 5' 5.25" (1.657 m)  Weight: 190 lb (86.183 kg)   Body mass index is 31.39 kg/(m^2). Ideal Body Weight: Weight in (lb) to have BMI = 25: 151.1  GEN: WDWN, NAD, Non-toxic, A & O x 3  dry HEENT: Atraumatic, Normocephalic. Neck supple. No masses, No LAD. Ears and Nose: No external deformity. CV: RRR, No M/G/R. No JVD. No thrill. No extra heart sounds. PULM: CTA B, no wheezes, crackles, rhonchi. No retractions. No resp. distress. No accessory muscle use. ABD: S, NT, ND, +BS. No rebound. No HSM. EXTR: No c/c/e NEURO Normal gait.  PSYCH: Normally interactive. Conversant. Not depressed or anxious appearing.  Calm demeanor.    Assessment and Plan: Gastroenteritis zofran Imodium Clears   Signed,  Phillips OdorJeffery Johnsie Moscoso, MD

## 2014-04-06 NOTE — Addendum Note (Signed)
Addended by: Felix AhmadiFRANSEN, Colleena Kurtenbach A on: 04/06/2014 02:47 PM   Modules accepted: Orders

## 2014-04-06 NOTE — Patient Instructions (Signed)
Viral Gastroenteritis Viral gastroenteritis is also known as stomach flu. This condition affects the stomach and intestinal tract. It can cause sudden diarrhea and vomiting. The illness typically lasts 3 to 8 days. Most people develop an immune response that eventually gets rid of the virus. While this natural response develops, the virus can make you quite ill. CAUSES  Many different viruses can cause gastroenteritis, such as rotavirus or noroviruses. You can catch one of these viruses by consuming contaminated food or water. You may also catch a virus by sharing utensils or other personal items with an infected person or by touching a contaminated surface. SYMPTOMS  The most common symptoms are diarrhea and vomiting. These problems can cause a severe loss of body fluids (dehydration) and a body salt (electrolyte) imbalance. Other symptoms may include:  Fever.  Headache.  Fatigue.  Abdominal pain. DIAGNOSIS  Your caregiver can usually diagnose viral gastroenteritis based on your symptoms and a physical exam. A stool sample may also be taken to test for the presence of viruses or other infections. TREATMENT  This illness typically goes away on its own. Treatments are aimed at rehydration. The most serious cases of viral gastroenteritis involve vomiting so severely that you are not able to keep fluids down. In these cases, fluids must be given through an intravenous line (IV). HOME CARE INSTRUCTIONS   Drink enough fluids to keep your urine clear or pale yellow. Drink small amounts of fluids frequently and increase the amounts as tolerated.  Ask your caregiver for specific rehydration instructions.  Avoid:  Foods high in sugar.  Alcohol.  Carbonated drinks.  Tobacco.  Juice.  Caffeine drinks.  Extremely hot or cold fluids.  Fatty, greasy foods.  Too much intake of anything at one time.  Dairy products until 24 to 48 hours after diarrhea stops.  You may consume probiotics.  Probiotics are active cultures of beneficial bacteria. They may lessen the amount and number of diarrheal stools in adults. Probiotics can be found in yogurt with active cultures and in supplements.  Wash your hands well to avoid spreading the virus.  Only take over-the-counter or prescription medicines for pain, discomfort, or fever as directed by your caregiver. Do not give aspirin to children. Antidiarrheal medicines are not recommended.  Ask your caregiver if you should continue to take your regular prescribed and over-the-counter medicines.  Keep all follow-up appointments as directed by your caregiver. SEEK IMMEDIATE MEDICAL CARE IF:   You are unable to keep fluids down.  You do not urinate at least once every 6 to 8 hours.  You develop shortness of breath.  You notice blood in your stool or vomit. This may look like coffee grounds.  You have abdominal pain that increases or is concentrated in one small area (localized).  You have persistent vomiting or diarrhea.  You have a fever.  The patient is a child younger than 3 months, and he or she has a fever.  The patient is a child older than 3 months, and he or she has a fever and persistent symptoms.  The patient is a child older than 3 months, and he or she has a fever and symptoms suddenly get worse.  The patient is a baby, and he or she has no tears when crying. MAKE SURE YOU:   Understand these instructions.  Will watch your condition.  Will get help right away if you are not doing well or get worse. Document Released: 09/18/2005 Document Revised: 12/11/2011 Document Reviewed: 07/05/2011   ExitCare Patient Information 2015 [employer]. This information is not intended to replace advice given to you by your health care provider. Make sure you discuss any questions you have with your health care provider. Clear Liquid Diet A clear liquid diet is a short-term diet that is prescribed to provide the necessary fluid and  basic energy you need when you can have nothing else. The clear liquid diet consists of liquids or solids that will become liquid at room temperature. You should be able to see through the liquid. There are many reasons that you may be restricted to clear liquids, such as:  When you have a sudden-onset (acute) condition that occurs before or after surgery.  To help your body slowly get adjusted to food again after a long period when you were unable to have food.  Replacement of fluids when you have a diarrheal disease.  When you are going to have certain exams, such as a colonoscopy, in which instruments are inserted inside your body to look at parts of your digestive system. WHAT CAN I HAVE? A clear liquid diet does not provide all the nutrients you need. It is important to choose a variety of the following items to get as many nutrients as possible:  Vegetable juices that do not have pulp.  Fruit juices and fruit drinks that do not have pulp.  Coffee (regular or decaffeinated), tea, or soda at the discretion of your health care provider.  Clear bouillon, broth, or strained broth-based soups.  High-protein and flavored gelatins.  Sugar or honey.  Ices or frozen ice pops that do not contain milk. If you are not sure whether you can have certain items, you should ask your health care provider. You may also ask your health care provider if there are any other clear liquid options. Document Released: 09/18/2005 Document Revised: 09/23/2013 Document Reviewed: 08/15/2013 ExitCare Patient Information 2015 [employer]. This information is not intended to replace advice given to you by your health care provider. Make sure you discuss any questions you have with your health care provider.  

## 2014-04-20 ENCOUNTER — Ambulatory Visit: Payer: 59 | Admitting: Gastroenterology

## 2014-04-30 ENCOUNTER — Other Ambulatory Visit: Payer: Self-pay | Admitting: Obstetrics and Gynecology

## 2014-05-01 LAB — CYTOLOGY - PAP

## 2015-05-04 ENCOUNTER — Other Ambulatory Visit: Payer: Self-pay | Admitting: Obstetrics and Gynecology

## 2015-05-06 LAB — CYTOLOGY - PAP

## 2015-05-28 ENCOUNTER — Ambulatory Visit (INDEPENDENT_AMBULATORY_CARE_PROVIDER_SITE_OTHER): Payer: 59 | Admitting: Family Medicine

## 2015-05-28 VITALS — BP 128/78 | HR 78 | Temp 99.0°F | Resp 18 | Ht 65.0 in | Wt 184.0 lb

## 2015-05-28 DIAGNOSIS — J029 Acute pharyngitis, unspecified: Secondary | ICD-10-CM | POA: Diagnosis not present

## 2015-05-28 DIAGNOSIS — L02411 Cutaneous abscess of right axilla: Secondary | ICD-10-CM

## 2015-05-28 LAB — POCT RAPID STREP A (OFFICE): RAPID STREP A SCREEN: NEGATIVE

## 2015-05-28 MED ORDER — PENICILLIN V POTASSIUM 500 MG PO TABS: 500.0000 mg | ORAL_TABLET | Freq: Two times a day (BID) | ORAL | Status: DC

## 2015-05-28 NOTE — Patient Instructions (Signed)
I will be in touch with your throat culture asap In the meantime we will start you on penicillin in case you do have strep.   Let me know if you are not feeling better soon- Sooner if worse.

## 2015-05-28 NOTE — Progress Notes (Addendum)
[facility] [address], [place] [address] [phone]  Date:  05/28/2015   Name:  Sarah Stephens   DOB:  12-08-1975   MRN:  [id number]  PCP:  Roxanne Mins, PA-C    Chief Complaint: Sore Throat and Nasal Congestion   History of Present Illness:  Sarah Stephens is a 39 y.o. very pleasant female patient who presents with the following:  Generally healthy woman here today with a ST.  Last weekend she went to a football game and was out in the rain. Over this week she has noted worsening sinus drainage, and ST that got a lot worse over the last 24 hours She had a boil under her right arm this week- however it already drained, she is treating it with peroxide and neosporin She is not sure, but thinks she might have had a fever at home She felt nauseated yesterday No earache She has not noted chills or body aches No vomiting or diarrhea  Patient Active Problem List   Diagnosis Date Noted  . LUQ pain 02/17/2014  . IBS (irritable bowel syndrome) 01/31/2014  . GAD (generalized anxiety disorder) 01/31/2014  . BMI 31.0-31.9,adult 01/31/2014  . Esophageal reflux 01/20/2014  . Dysphagia, unspecified(787.20) 01/20/2014    Past Medical History  Diagnosis Date  . Kidney stones   . Urinary bladder ulcer 2008    Past Surgical History  Procedure Laterality Date  . Ankle surgery Left   . Ovarian cyst removal    . Lithotripy      for kidney stones  . Bladder stem stretched      x 2    Social History  Substance Use Topics  . Smoking status: Never Smoker   . Smokeless tobacco: Never Used  . Alcohol Use: No    Family History  Problem Relation Age of Onset  . Diabetes Maternal Grandmother   . Heart disease Maternal Grandmother   . Colon cancer Neg Hx     No Known Allergies  Medication list has been reviewed and updated.  Current Outpatient Prescriptions on File Prior to Visit  Medication Sig Dispense Refill  . norgestimate-ethinyl  estradiol (ORTHO-CYCLEN,SPRINTEC,PREVIFEM) 0.25-35 MG-MCG tablet Take 1 tablet by mouth daily.    Marland Kitchen loperamide (IMODIUM A-D) 2 MG tablet 2 now and one hourly prn diarrhea.  Max 8 tabs in 24 hours 30 tablet 0  . ondansetron (ZOFRAN-ODT) 8 MG disintegrating tablet Take 1 tablet (8 mg total) by mouth every 8 (eight) hours as needed for nausea. 30 tablet 0  . pantoprazole (PROTONIX) 40 MG tablet Take 1 tablet (40 mg total) by mouth daily. 30 tablet 3  . phentermine 37.5 MG capsule Take 37.5 mg by mouth every morning.     No current facility-administered medications on file prior to visit.    Review of Systems:  As per HPI- otherwise negative.   Physical Examination: Filed Vitals:   05/28/15 0913  BP: 128/78  Pulse: 78  Temp: 99 F (37.2 C)  Resp: 18   Filed Vitals:   05/28/15 0913  Height:  (1.651 m)  Weight: 184 lb (83.462 kg)   Body mass index is 30.62 kg/(m^2). Ideal Body Weight: Weight in (lb) to have BMI = 25: 149.9  GEN: WDWN, NAD, Non-toxic, A & O x 3 HEENT: Atraumatic, Normocephalic. Neck supple. No masses, No LAD.  Bilateral TM wnl, oropharynx inflamed but no exudate.  PEERL,EOMI.   Ears and Nose: No external deformity. CV: RRR,  No M/G/R. No JVD. No thrill. No extra heart sounds. PULM: CTA B, no wheezes, crackles, rhonchi. No retractions. No resp. distress. No accessory muscle use. EXTR: No c/c/e NEURO Normal gait.  PSYCH: Normally interactive. Conversant. Not depressed or anxious appearing.  Calm demeanor.  Right axilla- there is a healing wound- no current fluctuance or evidence of active infection in the axilla  Results for orders placed or performed in visit on 05/28/15  POCT rapid strep A  Result Value Ref Range   Rapid Strep A Screen Negative Negative    Assessment and Plan: Acute pharyngitis, unspecified pharyngitis type - Plan: POCT rapid strep A, Culture, Group A Strep, penicillin v potassium (VEETID) 500 MG tablet  Abscess of right axilla - Plan:  Culture, Group A Strep  Cover for strep with penicillin VK Abscess of axilla is doing well Await culture and will be in touch with her  Signed Abbe Amsterdam, MD  Called with culture 8/28. She feels that her sx are now more of a URI with runny nose,  She will stop abx and let me know if not improving soon  Results for orders placed or performed in visit on 05/28/15  Culture, Group A Strep  Result Value Ref Range   Organism ID, Bacteria Normal Upper Respiratory Flora    Organism ID, Bacteria No Beta Hemolytic Streptococci Isolated   POCT rapid strep A  Result Value Ref Range   Rapid Strep A Screen Negative Negative

## 2015-05-29 LAB — CULTURE, GROUP A STREP: Organism ID, Bacteria: NORMAL

## 2015-07-13 ENCOUNTER — Encounter: Payer: Self-pay | Admitting: Physician Assistant

## 2015-07-13 ENCOUNTER — Ambulatory Visit (INDEPENDENT_AMBULATORY_CARE_PROVIDER_SITE_OTHER): Payer: 59 | Admitting: Family Medicine

## 2015-07-13 VITALS — BP 118/80 | HR 97 | Temp 98.3°F | Resp 20

## 2015-07-13 DIAGNOSIS — T7840XA Allergy, unspecified, initial encounter: Secondary | ICD-10-CM

## 2015-07-13 DIAGNOSIS — T7801XA Anaphylactic reaction due to peanuts, initial encounter: Secondary | ICD-10-CM

## 2015-07-13 DIAGNOSIS — L299 Pruritus, unspecified: Secondary | ICD-10-CM

## 2015-07-13 MED ORDER — EPINEPHRINE 0.3 MG/0.3ML IJ SOAJ: 0.3000 mg | Freq: Once | INTRAMUSCULAR | Status: AC

## 2015-07-13 MED ORDER — METHYLPREDNISOLONE SODIUM SUCC 125 MG IJ SOLR
125.0000 mg | Freq: Once | INTRAMUSCULAR | Status: AC
  Administered 2015-07-13: 125 mg via INTRAVENOUS

## 2015-07-13 MED ORDER — EPINEPHRINE HCL 1 MG/ML IJ SOLN
0.3000 mL | Freq: Once | INTRAMUSCULAR | Status: AC
  Administered 2015-07-13: 0.3 mg via INTRAMUSCULAR

## 2015-07-13 MED ORDER — DIPHENHYDRAMINE HCL 50 MG/ML IJ SOLN: 50.0000 mg | Freq: Once | INTRAMUSCULAR | Status: DC

## 2015-07-13 MED ORDER — DIPHENHYDRAMINE HCL 50 MG/ML IJ SOLN
25.0000 mg | Freq: Once | INTRAMUSCULAR | Status: AC
  Administered 2015-07-13: 25 mg via INTRAMUSCULAR

## 2015-07-13 NOTE — Progress Notes (Signed)
Sarah Stephens  MRN: [id number] DOB: 02/05/1976  Subjective:  Pt presents to clinic with the sensation that her throat is closing.  About 9:15am she eat a Reece's Cup and afterwards she felt nauseated and then threw up within 10-15 minutes after ingestion.  Then she started to itch and her bottom lip started to swell and she started to feel like she might stop breathing and she came here immediately.  She did take benadryl  when this started.  She has noticed recently the more she eats peanuts the more she feels bad.    Patient Active Problem List   Diagnosis Date Noted  . LUQ pain 02/17/2014  . IBS (irritable bowel syndrome) 01/31/2014  . GAD (generalized anxiety disorder) 01/31/2014  . BMI 31.0-31.9,adult 01/31/2014  . Esophageal reflux 01/20/2014  . Dysphagia, unspecified(787.20) 01/20/2014    Current Outpatient Prescriptions on File Prior to Visit  Medication Sig Dispense Refill  . norgestimate-ethinyl estradiol (ORTHO-CYCLEN,SPRINTEC,PREVIFEM) 0.25-35 MG-MCG tablet Take 1 tablet by mouth daily.    Marland Kitchen loperamide (IMODIUM A-D) 2 MG tablet 2 now and one hourly prn diarrhea.  Max 8 tabs in 24 hours (Patient not taking: Reported on 07/13/2015) 30 tablet 0  . ondansetron (ZOFRAN-ODT) 8 MG disintegrating tablet Take 1 tablet (8 mg total) by mouth every 8 (eight) hours as needed for nausea. (Patient not taking: Reported on 07/13/2015) 30 tablet 0  . pantoprazole (PROTONIX) 40 MG tablet Take 1 tablet (40 mg total) by mouth daily. (Patient not taking: Reported on 07/13/2015) 30 tablet 3  . penicillin v potassium (VEETID) 500 MG tablet Take 1 tablet (500 mg total) by mouth 2 (two) times daily. (Patient not taking: Reported on 07/13/2015) 20 tablet 0   No current facility-administered medications on file prior to visit.    No Known Allergies  Review of Systems Objective:  BP 140/100 mmHg  Pulse 97  Temp(Src) 98.3 F (36.8 C) (Oral)  Resp 20  SpO2 99%  Physical Exam    Constitutional: She is oriented to person, place, and time and well-developed, well-nourished, and in no distress.  HENT:  Head: Normocephalic and atraumatic.  Right Ear: Hearing and external ear normal.  Left Ear: Hearing and external ear normal.  Eyes: Conjunctivae are normal.  Neck: Normal range of motion.  Cardiovascular: Normal rate, regular rhythm and normal heart sounds.   No murmur heard. Pulmonary/Chest: Effort normal and breath sounds normal. She has no wheezes.  Neurological: She is alert and oriented to person, place, and time. Gait normal.  Skin: Skin is warm and dry.  Hives on her left upper arm, lower lip seems swollen  Psychiatric: Mood, memory, affect and judgment normal.  Vitals reviewed.  At 11:55 - low swelling has reduced, hives resolved.  Assessment and Plan :  Allergic reaction, initial encounter - Plan: EPINEPHrine (ADRENALIN) injection 0.3 mg, diphenhydrAMINE (BENADRYL) injection 25 mg, methylPREDNISolone sodium succinate (SOLU-MEDROL) 125 mg/2 mL injection 125 mg,   Peanut-induced anaphylaxis, initial encounter - Plan: EPINEPHrine 0.3 mg/0.3 mL IJ SOAJ injection  Itching  Pt was brought back emergently with feeling of throat closing - she was immediately seen and evaluated.  An IV was started after medications and she started feeling better - the lip resolved as did the hives and the feeling in her throat resolved.  She was given an Epipen for home use and instructed on its use and how and when to use and she was instructed to seek medical care if she has to use the  Epipen.  She should avoid peanuts in the future. She will take Zantac and zyrtec for a week following this incident.  Benny Lennert PA-C  [facility] 07/13/2015 11:42 AM

## 2015-07-13 NOTE — Patient Instructions (Signed)
Avoid peanuts Use Epien if needed and then seek medical care if you have to use it Zantac  daily for the next week Zyrtec  daily for the next week

## 2015-07-17 NOTE — Progress Notes (Signed)
Agree with A/P. Dr Quoc Tome 

## 2015-07-21 ENCOUNTER — Other Ambulatory Visit: Payer: Self-pay

## 2015-07-21 MED ORDER — CETIRIZINE HCL 10 MG PO TABS: 10.0000 mg | ORAL_TABLET | Freq: Every day | ORAL | Status: DC

## 2015-07-21 NOTE — Telephone Encounter (Signed)
Pt was seen here on 10/11 by Sarah AbedSarah W. & she was told to get 2 medications. Her flex spending card will not pay for Zantac and Zyrtec. She needs a script for these so her card will pay for them. Please advise at [phone]

## 2015-07-21 NOTE — Telephone Encounter (Signed)
Ok, what mg of Zantac?

## 2016-03-28 ENCOUNTER — Ambulatory Visit (INDEPENDENT_AMBULATORY_CARE_PROVIDER_SITE_OTHER): Payer: Self-pay | Admitting: Physician Assistant

## 2016-03-28 VITALS — BP 126/80 | HR 86 | Temp 98.6°F | Resp 17 | Ht 65.0 in | Wt 184.0 lb

## 2016-03-28 DIAGNOSIS — B9789 Other viral agents as the cause of diseases classified elsewhere: Principal | ICD-10-CM

## 2016-03-28 DIAGNOSIS — J069 Acute upper respiratory infection, unspecified: Secondary | ICD-10-CM

## 2016-03-28 MED ORDER — HYDROCODONE-HOMATROPINE 5-1.5 MG/5ML PO SYRP: 2.5000 mL | ORAL_SOLUTION | Freq: Every evening | ORAL | Status: DC | PRN

## 2016-03-28 MED ORDER — IBUPROFEN 600 MG PO TABS: 600.0000 mg | ORAL_TABLET | Freq: Three times a day (TID) | ORAL | Status: DC | PRN

## 2016-03-28 MED ORDER — CETIRIZINE-PSEUDOEPHEDRINE ER 5-120 MG PO TB12: 1.0000 | ORAL_TABLET | Freq: Two times a day (BID) | ORAL | Status: DC

## 2016-03-28 MED ORDER — BENZONATATE 200 MG PO CAPS: 200.0000 mg | ORAL_CAPSULE | Freq: Two times a day (BID) | ORAL | Status: DC | PRN

## 2016-03-28 NOTE — Patient Instructions (Signed)
     IF you received an x-ray today, you will receive an invoice from [facility]. Please contact [facility] at [phone] with questions or concerns regarding your invoice.   IF you received labwork today, you will receive an invoice from [facility]. Please contact [facility] at [phone] with questions or concerns regarding your invoice.   Our billing staff will not be able to assist you with questions regarding bills from these companies.  You will be contacted with the lab results as soon as they are available. The fastest way to get your results is to activate your My Chart account. Instructions are located on the last page of this paperwork. If you have not heard from us regarding the results in 2 weeks, please contact this office.      

## 2016-03-28 NOTE — Progress Notes (Signed)
03/28/2016 11:38 AM   DOB: 1976/05/29 / MRN: [id number]  SUBJECTIVE:  Sarah Stephens is a 40 y.o. female presenting for cough and nasal congestion that started 7 days ago.  She associates ear pressure and and HA.  Also has had cough that has caused her to vomit.  No sick family members and she denies sick contacts.  She has been taking OTC sinus cold tabs with some relief.  She is taking OCP's without fail.   She is allergic to zithromax.   She  has a past medical history of Kidney stones and Urinary bladder ulcer (2008).    She  reports that she has never smoked. She has never used smokeless tobacco. She reports that she does not drink alcohol or use illicit drugs. She  has no sexual activity history on file. The patient  has past surgical history that includes Ankle surgery (Left); Ovarian cyst removal; lithotripy; and bladder stem stretched.  Her family history includes Diabetes in her maternal grandmother; Heart disease in her maternal grandmother. There is no history of Colon cancer.  Review of Systems  Constitutional: Negative for fever and chills.  Eyes: Negative for blurred vision.  Respiratory: Negative for cough and shortness of breath.   Cardiovascular: Negative for chest pain.  Gastrointestinal: Negative for nausea and abdominal pain.  Genitourinary: Negative for dysuria, urgency and frequency.  Musculoskeletal: Negative for myalgias.  Skin: Negative for rash.  Neurological: Negative for dizziness, tingling and headaches.  Psychiatric/Behavioral: Negative for depression. The patient is not nervous/anxious.     Problem list and medications reviewed and updated by myself where necessary, and exist elsewhere in the encounter.   OBJECTIVE:  BP 126/80 mmHg  Pulse 86  Temp(Src) 98.6 F (37 C) (Oral)  Resp 17  Ht 5\' 5"  (1.651 m)  Wt 184 lb (83.462 kg)  BMI 30.62 kg/m2  SpO2 100%  Physical Exam  Constitutional: She is oriented to person, place, and time. She appears  well-nourished. No distress.  HENT:  Right Ear: Tympanic membrane normal.  Left Ear: Tympanic membrane normal.  Nose: Nose normal.  Mouth/Throat: Uvula is midline, oropharynx is clear and moist and mucous membranes are normal.  Eyes: EOM are normal. Pupils are equal, round, and reactive to light.  Cardiovascular: Normal rate.   Pulmonary/Chest: Effort normal.  Abdominal: She exhibits no distension.  Neurological: She is alert and oriented to person, place, and time. No cranial nerve deficit. Gait normal.  Skin: Skin is dry. She is not diaphoretic.  Psychiatric: She has a normal mood and affect.  Vitals reviewed.   No results found for this or any previous visit (from the past 72 hour(s)).  No results found.  ASSESSMENT AND PLAN  Adair LaundryLatonya was seen today for cough and sinusitis.  Diagnoses and all orders for this visit:  Viral URI with cough -     cetirizine-pseudoephedrine (ZYRTEC-D ALLERGY & CONGESTION) 5-120 MG tablet; Take 1 tablet by mouth 2 (two) times daily. -     ibuprofen (ADVIL,MOTRIN) 600 MG tablet; Take 1 tablet (600 mg total) by mouth every 8 (eight) hours as needed. -     HYDROcodone-homatropine (HYCODAN) 5-1.5 MG/5ML syrup; Take 2.5-5 mLs by mouth at bedtime as needed. -     benzonatate (TESSALON) 200 MG capsule; Take 1 capsule (200 mg total) by mouth 2 (two) times daily as needed for cough.    The patient was advised to call or return to clinic if she does not see an improvement in  symptoms or to seek the care of the closest emergency department if she worsens with the above plan.   Deliah BostonMichael Almetta Liddicoat, MHS, PA-C [facility] 03/28/2016 11:38 AM

## 2016-06-16 ENCOUNTER — Other Ambulatory Visit: Payer: Self-pay | Admitting: Obstetrics and Gynecology

## 2016-06-20 LAB — CYTOLOGY - PAP

## 2016-11-12 DIAGNOSIS — N10 Acute tubulo-interstitial nephritis: Secondary | ICD-10-CM | POA: Diagnosis not present

## 2016-11-12 DIAGNOSIS — R509 Fever, unspecified: Secondary | ICD-10-CM | POA: Diagnosis not present

## 2016-11-12 DIAGNOSIS — M545 Low back pain: Secondary | ICD-10-CM | POA: Diagnosis not present

## 2016-11-12 DIAGNOSIS — R110 Nausea: Secondary | ICD-10-CM | POA: Diagnosis not present

## 2016-11-12 DIAGNOSIS — R3915 Urgency of urination: Secondary | ICD-10-CM | POA: Diagnosis not present

## 2016-11-13 DIAGNOSIS — N12 Tubulo-interstitial nephritis, not specified as acute or chronic: Secondary | ICD-10-CM | POA: Diagnosis not present

## 2017-06-29 DIAGNOSIS — Z124 Encounter for screening for malignant neoplasm of cervix: Secondary | ICD-10-CM | POA: Diagnosis not present

## 2017-06-29 DIAGNOSIS — Z01419 Encounter for gynecological examination (general) (routine) without abnormal findings: Secondary | ICD-10-CM | POA: Diagnosis not present

## 2017-11-22 ENCOUNTER — Encounter (HOSPITAL_COMMUNITY): Payer: Self-pay | Admitting: Emergency Medicine

## 2017-11-22 ENCOUNTER — Ambulatory Visit (HOSPITAL_COMMUNITY)
Admission: EM | Admit: 2017-11-22 | Discharge: 2017-11-22 | Disposition: A | Discharge status: Home / Self Care | Payer: 59 | Attending: Family Medicine | Admitting: Family Medicine

## 2017-11-22 DIAGNOSIS — H9209 Otalgia, unspecified ear: Secondary | ICD-10-CM | POA: Diagnosis present

## 2017-11-22 DIAGNOSIS — J069 Acute upper respiratory infection, unspecified: Secondary | ICD-10-CM

## 2017-11-22 DIAGNOSIS — B9789 Other viral agents as the cause of diseases classified elsewhere: Secondary | ICD-10-CM

## 2017-11-22 DIAGNOSIS — J00 Acute nasopharyngitis [common cold]: Secondary | ICD-10-CM

## 2017-11-22 DIAGNOSIS — J029 Acute pharyngitis, unspecified: Secondary | ICD-10-CM | POA: Diagnosis not present

## 2017-11-22 LAB — POCT RAPID STREP A: Streptococcus, Group A Screen (Direct): NEGATIVE

## 2017-11-22 MED ORDER — CETIRIZINE-PSEUDOEPHEDRINE ER 5-120 MG PO TB12: 1.0000 | ORAL_TABLET | Freq: Two times a day (BID) | ORAL | 0 refills | Status: DC

## 2017-11-22 MED ORDER — SALINE SPRAY 0.65 % NA SOLN: 1.0000 | NASAL | 0 refills | Status: DC | PRN

## 2017-11-22 NOTE — ED Provider Notes (Signed)
[facility]    CSN: [id number] Arrival date & time: 11/22/17  1202     History   Chief Complaint Chief Complaint  Patient presents with  . URI    HPI Sarah Stephens is a 42 y.o. female.   Sarah Stephens presents with complaints of sore throat, ear pain, congestion, sneezing, hot and cold flashes which started 2-3 days ago. No specific known fevers. Daughter has cough, had strep throat two weeks ago. Only occasional cough. Decreased appetite but without vomiting or diarrhea. Has noted increased frequency of bowel movements. Denies any current pain. Took nyquil last night which helped. History of kidney stones, IBS, anxiety.   ROS per HPI.       Past Medical History:  Diagnosis Date  . Kidney stones   . Urinary bladder ulcer 2008    Patient Active Problem List   Diagnosis Date Noted  . LUQ pain 02/17/2014  . IBS (irritable bowel syndrome) 01/31/2014  . GAD (generalized anxiety disorder) 01/31/2014  . BMI 31.0-31.9,adult 01/31/2014  . Esophageal reflux 01/20/2014  . Dysphagia, unspecified(787.20) 01/20/2014    Past Surgical History:  Procedure Laterality Date  . ANKLE SURGERY Left   . bladder stem stretched     x 2  . lithotripy     for kidney stones  . OVARIAN CYST REMOVAL      OB History    No data available       Home Medications    Prior to Admission medications   Medication Sig Start Date End Date Taking? Authorizing Provider  benzonatate (TESSALON) 200 MG capsule Take 1 capsule (200 mg total) by mouth 2 (two) times daily as needed for cough. 03/28/16   Ofilia Neaslark, Michael L, PA-C  cetirizine (ZYRTEC) 10 MG tablet Take 1 tablet (10 mg total) by mouth daily. 07/21/15   Brewington, Tishira R, PA-C  cetirizine-pseudoephedrine (ZYRTEC-D ALLERGY & CONGESTION) 5-120 MG tablet Take 1 tablet by mouth 2 (two) times daily. 11/22/17   Linus MakoBurky, Georgia Delsignore B, NP  EPINEPHrine 0.3 mg/0.3 mL IJ SOAJ injection Inject 0.3 mLs (0.3 mg total) into the muscle once. 07/13/15    Weber, Dema SeverinSarah L, PA-C  HYDROcodone-homatropine (HYCODAN) 5-1.5 MG/5ML syrup Take 2.5-5 mLs by mouth at bedtime as needed. 03/28/16   Ofilia Neaslark, Michael L, PA-C  ibuprofen (ADVIL,MOTRIN) 600 MG tablet Take 1 tablet (600 mg total) by mouth every 8 (eight) hours as needed. 03/28/16   Ofilia Neaslark, Michael L, PA-C  norgestimate-ethinyl estradiol (ORTHO-CYCLEN,SPRINTEC,PREVIFEM) 0.25-35 MG-MCG tablet Take 1 tablet by mouth daily.    [provider]  sodium chloride (OCEAN) 0.65 % SOLN nasal spray Place 1 spray into both nostrils as needed for congestion. 11/22/17   Georgetta HaberBurky, Shakeia Krus B, NP    Family History Family History  Problem Relation Age of Onset  . Diabetes Maternal Grandmother   . Heart disease Maternal Grandmother   . Colon cancer Neg Hx     Social History Social History   Tobacco Use  . Smoking status: Never Smoker  . Smokeless tobacco: Never Used  Substance Use Topics  . Alcohol use: No  . Drug use: No     Allergies   Peanut-containing drug products; Latex; and Zithromax [azithromycin]   Review of Systems Review of Systems   Physical Exam Triage Vital Signs ED Triage Vitals  Enc Vitals Group     BP 11/22/17 1213 (!) 147/101     Pulse Rate 11/22/17 1213 85     Resp 11/22/17 1213 16  Temp 11/22/17 1213 98.2 F (36.8 C)     Temp Source 11/22/17 1213 Oral     SpO2 11/22/17 1213 99 %     Weight 11/22/17 1213 185 lb (83.9 kg)     Height 11/22/17 1213 5\' 5"  (1.651 m)     Head Circumference --      Peak Flow --      Pain Score 11/22/17 1214 0     Pain Loc --      Pain Edu? --      Excl. in GC? --    No data found.  Updated Vital Signs BP (!) 147/101 (BP Location: Right Arm)   Pulse 85   Temp 98.2 F (36.8 C) (Oral)   Resp 16   Ht 5\' 5"  (1.651 m)   Wt 185 lb (83.9 kg)   LMP 10/25/2017   SpO2 99%   BMI 30.79 kg/m   Visual Acuity Right Eye Distance:   Left Eye Distance:   Bilateral Distance:    Right Eye Near:   Left Eye Near:    Bilateral Near:      Physical Exam  Constitutional: She is oriented to person, place, and time. She appears well-developed and well-nourished. No distress.  HENT:  Head: Normocephalic and atraumatic.  Right Ear: Tympanic membrane, external ear and ear canal normal.  Left Ear: Tympanic membrane, external ear and ear canal normal.  Nose: Rhinorrhea present.  Mouth/Throat: Uvula is midline, oropharynx is clear and moist and mucous membranes are normal. No tonsillar exudate.  Eyes: Conjunctivae and EOM are normal. Pupils are equal, round, and reactive to light.  Cardiovascular: Normal rate, regular rhythm and normal heart sounds.  Pulmonary/Chest: Effort normal and breath sounds normal.  Lymphadenopathy:    She has cervical adenopathy.  Neurological: She is alert and oriented to person, place, and time.  Skin: Skin is warm and dry.     UC Treatments / Results  Labs (all labs ordered are listed, but only abnormal results are displayed) Labs Reviewed  CULTURE, GROUP A STREP [facility])  POCT RAPID STREP A    EKG  EKG Interpretation None       Radiology No results found.  Procedures Procedures (including critical care time)  Medications Ordered in UC Medications - No data to display   Initial Impression / Assessment and Plan / UC Course  I have reviewed the triage vital signs and the nursing notes.  Pertinent labs & imaging results that were available during my care of the patient were reviewed by me and considered in my medical decision making (see chart for details).     Negative rapid strep. Benign physical findings on exam. Non toxic in appearance. History and physical consistent with viral illness.  Supportive cares recommended. If symptoms worsen or do not improve in the next week to return to be seen or to follow up with PCP.  Patient verbalized understanding and agreeable to plan.    Final Clinical Impressions(s) / UC Diagnoses   Final diagnoses:  Acute nasopharyngitis    ED  Discharge Orders        Ordered    cetirizine-pseudoephedrine (ZYRTEC-D ALLERGY & CONGESTION) 5-120 MG tablet  2 times daily     11/22/17 1251    sodium chloride (OCEAN) 0.65 % SOLN nasal spray  As needed     11/22/17 1251       Controlled Substance Prescriptions [place] Controlled Substance Registry consulted? Not Applicable   Georgetta Haber, NP 11/22/17 1257

## 2017-11-22 NOTE — ED Triage Notes (Signed)
PT reports congestion, sinus pressure, cough, sore throat, ear pain for 3 days.

## 2017-11-22 NOTE — Discharge Instructions (Signed)
Push fluids to ensure adequate hydration and keep secretions thin.  Tylenol and/or ibuprofen as needed for pain or fevers.  Daily zyrtec d to help with congestion and sneezing.  May continue with nyquil at night as needed. Nasal saline for congestion and to keep nares moist. If symptoms worsen or do not improve in the next week to return to be seen or to follow up with your PCP.

## 2017-11-24 ENCOUNTER — Other Ambulatory Visit: Payer: Self-pay

## 2017-11-24 ENCOUNTER — Ambulatory Visit (HOSPITAL_COMMUNITY)
Admission: EM | Admit: 2017-11-24 | Discharge: 2017-11-24 | Disposition: A | Discharge status: Home / Self Care | Payer: 59 | Attending: Family Medicine | Admitting: Family Medicine

## 2017-11-24 ENCOUNTER — Encounter (HOSPITAL_COMMUNITY): Payer: Self-pay

## 2017-11-24 DIAGNOSIS — R109 Unspecified abdominal pain: Secondary | ICD-10-CM | POA: Diagnosis not present

## 2017-11-24 DIAGNOSIS — N3000 Acute cystitis without hematuria: Secondary | ICD-10-CM | POA: Diagnosis not present

## 2017-11-24 MED ORDER — SULFAMETHOXAZOLE-TRIMETHOPRIM 800-160 MG PO TABS: 1.0000 | ORAL_TABLET | Freq: Two times a day (BID) | ORAL | 0 refills | Status: AC

## 2017-11-24 MED ORDER — CEFTRIAXONE SODIUM 1 G IJ SOLR
INTRAMUSCULAR | Status: AC
  Filled 2017-11-24: qty 10

## 2017-11-24 MED ORDER — CEFTRIAXONE SODIUM 1 G IJ SOLR
1.0000 g | Freq: Once | INTRAMUSCULAR | Status: AC
  Administered 2017-11-24: 1 g via INTRAMUSCULAR

## 2017-11-24 MED ORDER — LIDOCAINE HCL (PF) 1 % IJ SOLN
INTRAMUSCULAR | Status: AC
  Filled 2017-11-24: qty 2

## 2017-11-24 NOTE — ED Provider Notes (Signed)
  [facility]  Chief Complaint  Patient presents with  . Back Pain    Sarah Stephens is a 42 y.o. female here for possible UTI.  Duration: 1 week. Symptoms: urinary frequency, urinary hesitancy, urinary retention, flank pain on right and dysuria Denies: hematuria, fever, nausea and vomiting, vaginal discharge Hx of recurrent UTI? No  +hx of pyelonephritis and renal stones Denies new sexual partners.  ROS:  Constitutional: denies fever GU: As noted in HPI  Past Medical History:  Diagnosis Date  . Kidney stones   . Urinary bladder ulcer 2008    BP 138/90 (BP Location: Left Arm)   Pulse 85   Temp 98.8 F (37.1 C) (Oral)   Resp 16   LMP 10/25/2017   SpO2 100%  General: Awake, alert, appears stated age HEENT: MMM Heart: RRR, no murmurs Lungs: CTAB, normal respiratory effort, no accessory muscle usage Abd: BS+, soft, NT, ND, no masses or organomegaly MSK: +R CVA tenderness Psych: Age appropriate judgment and insight  Acute cystitis without hematuria  Rocephin today while here given flank pain and hx, will tx with Bactrim PO for when she goes home. Stay hydrated. Seek immediate care if pt starts to develop fevers, new/worsening symptoms, uncontrollable N/V. F/u prn. The patient voiced understanding and agreement to the plan.  Jilda Rocheicholas Paul VictorWendling, OhioDO 11-24-17 3:07 PM    Sharlene DoryWendling, Anais Koenen Paul, DO 11/24/17 [phone]

## 2017-11-24 NOTE — ED Triage Notes (Signed)
Pt presents today with right lower back pain that has been going on for a week. Has stated she has had some urinary urgency and frequency. Some dysuria. Has a hx of kidney and bladder problems. Thinks it could be a bad UTI.

## 2017-11-24 NOTE — Discharge Instructions (Signed)
Drink lots of fluids

## 2017-11-25 LAB — CULTURE, GROUP A STREP (THRC)

## 2018-07-16 DIAGNOSIS — Z1231 Encounter for screening mammogram for malignant neoplasm of breast: Secondary | ICD-10-CM | POA: Diagnosis not present

## 2018-07-16 DIAGNOSIS — Z01419 Encounter for gynecological examination (general) (routine) without abnormal findings: Secondary | ICD-10-CM | POA: Diagnosis not present

## 2018-07-16 DIAGNOSIS — Z124 Encounter for screening for malignant neoplasm of cervix: Secondary | ICD-10-CM | POA: Diagnosis not present

## 2018-07-18 ENCOUNTER — Other Ambulatory Visit: Payer: Self-pay | Admitting: Obstetrics and Gynecology

## 2018-07-18 DIAGNOSIS — R928 Other abnormal and inconclusive findings on diagnostic imaging of breast: Secondary | ICD-10-CM

## 2018-07-19 ENCOUNTER — Other Ambulatory Visit: Payer: Self-pay | Admitting: Obstetrics and Gynecology

## 2018-07-19 ENCOUNTER — Ambulatory Visit
Admission: RE | Admit: 2018-07-19 | Discharge: 2018-07-19 | Disposition: A | Discharge status: Home / Self Care | Payer: 59 | Source: Ambulatory Visit | Attending: Obstetrics and Gynecology | Admitting: Obstetrics and Gynecology

## 2018-07-19 DIAGNOSIS — N6011 Diffuse cystic mastopathy of right breast: Secondary | ICD-10-CM | POA: Diagnosis not present

## 2018-07-19 DIAGNOSIS — R928 Other abnormal and inconclusive findings on diagnostic imaging of breast: Secondary | ICD-10-CM

## 2018-07-19 DIAGNOSIS — N6310 Unspecified lump in the right breast, unspecified quadrant: Secondary | ICD-10-CM

## 2018-07-19 DIAGNOSIS — R922 Inconclusive mammogram: Secondary | ICD-10-CM | POA: Diagnosis not present

## 2018-12-12 DIAGNOSIS — R0982 Postnasal drip: Secondary | ICD-10-CM | POA: Diagnosis not present

## 2018-12-26 ENCOUNTER — Other Ambulatory Visit: Payer: Self-pay

## 2018-12-26 ENCOUNTER — Ambulatory Visit: Payer: 59 | Admitting: Allergy

## 2018-12-26 ENCOUNTER — Encounter: Payer: Self-pay | Admitting: Allergy

## 2018-12-26 VITALS — BP 146/96 | HR 68 | Temp 98.3°F | Resp 18 | Ht 65.5 in | Wt 184.0 lb

## 2018-12-26 DIAGNOSIS — T7800XD Anaphylactic reaction due to unspecified food, subsequent encounter: Secondary | ICD-10-CM | POA: Diagnosis not present

## 2018-12-26 DIAGNOSIS — H1013 Acute atopic conjunctivitis, bilateral: Secondary | ICD-10-CM

## 2018-12-26 DIAGNOSIS — J3089 Other allergic rhinitis: Secondary | ICD-10-CM | POA: Diagnosis not present

## 2018-12-26 MED ORDER — OLOPATADINE HCL 0.1 % OP SOLN: 1.0000 [drp] | Freq: Two times a day (BID) | OPHTHALMIC | 4 refills | Status: AC

## 2018-12-26 MED ORDER — EPINEPHRINE 0.3 MG/0.3ML IJ SOAJ: 0.3000 mg | INTRAMUSCULAR | 2 refills | Status: AC | PRN

## 2018-12-26 NOTE — Progress Notes (Signed)
New Patient Note  RE: Sarah Stephens MRN: [id number] DOB: 1976/01/21 Date of Office Visit: 12/26/2018  Referring provider: Ronnald Collum Primary care provider: No primary care provider on file.  - remove  Chief Complaint: food allergy and environmental allergy  History of present illness: Sarah Stephens is a 43 y.o. female presenting today for consultation for allergies.   She states she keeps having episodes where she will develop hives and difficulty swallowing with scratchy throat and throat tightening sensation.  She is concerned about different foods.  She also states she has developed itching from chicken at [facility] and is concerned about the peanut oil.  She states with contact exposure with pecan she developed hives in the area of contact.  She has noticed with tomatoes/tomato sauce ingestion she will break out.  She is also concerned about wheat or breads as well causing itch and hives.   She has a history of peanut allergy since 2016.   She had a reaction in October 2016 where she ate a [employer]ese[employer] cup and afterwards felt nauseated and then had emesis about 15 minutes afterwards.  She recalls feeling itchy and having lip swelling as well and difficulty breathing.  She went to an urgent care and was treated with IV medications.  She was prescribed a EpiPen.  Her Epipen now is expired.    She reports symptoms of sneezing, nasal congestion and drainage, itchy eyes, throat itch.  She has been taking either zyrtec or zyrtec-D which does help. She has used Bausch-Lomb eye drops.  She states she does not like to use nasal sprays.  She states that pollens and dust cause her symptoms.  She does state her job is dusty.  She denies history of asthma.   She reports history of eczema that is more of an issue when the weather is warm.  She has used her son's mometasone for flares which does work.    Review of systems: Review of Systems  Constitutional: Negative for chills, fever  and malaise/fatigue.  HENT: Positive for congestion. Negative for ear discharge, nosebleeds, sinus pain and sore throat.   Eyes: Negative for pain, discharge and redness.  Respiratory: Negative for cough, shortness of breath and wheezing.   Cardiovascular: Negative for chest pain.  Gastrointestinal: Negative for abdominal pain, constipation, diarrhea, heartburn, nausea and vomiting.  Musculoskeletal: Negative for joint pain.  Skin: Positive for itching and rash.  Neurological: Negative for headaches.    All other systems negative unless noted above in HPI  Past medical history: Past Medical History:  Diagnosis Date  . Kidney stones   . Urinary bladder ulcer 2008    Past surgical history: Past Surgical History:  Procedure Laterality Date  . ANKLE SURGERY Left   . bladder stem stretched     x 2  . lithotripy     for kidney stones  . OVARIAN CYST REMOVAL      Family history:  Family History  Problem Relation Age of Onset  . Diabetes Maternal Grandmother   . Heart disease Maternal Grandmother   . Allergic rhinitis Son   . Asthma Son   . Asthma Daughter   . Colon cancer Neg Hx     Social history: Lives in an apartment with carpeting with electric heating and central cooling.  No pets in the home.  No concern for water damage, mildew or roaches in the home.  She is a Multimedia programmer.  No smoke exposure or history.  Medication List: Allergies as of 12/26/2018      Reactions   Peanut-containing Drug Products Anaphylaxis   ALLERGIC TO ALL NUTS   Latex Rash   Zithromax [azithromycin] Rash      Medication List       Accurate as of December 26, 2018  4:03 PM. Always use your most recent med list.        EPINEPHrine 0.3 mg/0.3 mL Soaj injection Commonly known as:  EPI-PEN Inject 0.3 mLs (0.3 mg total) into the muscle once.   EPINEPHrine 0.3 mg/0.3 mL Soaj injection Commonly known as:  Auvi-Q Inject 0.3 mLs (0.3 mg total) into the muscle as needed for  anaphylaxis.   olopatadine 0.1 % ophthalmic solution Commonly known as:  PATANOL Place 1 drop into both eyes 2 (two) times daily.       Known medication allergies: Allergies  Allergen Reactions  . Peanut-Containing Drug Products Anaphylaxis    ALLERGIC TO ALL NUTS  . Latex Rash  . Zithromax [Azithromycin] Rash     Physical examination: Blood pressure (!) 146/96, pulse 68, temperature 98.3 F (36.8 C), temperature source Oral, resp. rate 18, height 5' 5.5" (1.664 m), weight 184 lb (83.5 kg), SpO2 98 %.  General: Alert, interactive, in no acute distress. HEENT: PERRLA, TMs pearly gray, turbinates moderately edematous without discharge, post-pharynx non erythematous. Neck: Supple without lymphadenopathy. Lungs: Clear to auscultation without wheezing, rhonchi or rales. {no increased work of breathing. CV: Normal S1, S2 without murmurs. Abdomen: Nondistended, nontender. Skin: Warm and dry, without lesions or rashes. Extremities:  No clubbing, cyanosis or edema. Neuro:   Grossly intact.  Diagnositics/Labs:  Allergy testing: environmental allergy skin prick testing is positive to [place] grass, dust mites.   Select food allergy skin prick testing is negative.   Intradermal testing is positive to grass mix, ragweed mix, tree mix, mold mix 4   Allergy testing results were read and interpreted by provider, documented by clinical staff.   Assessment and plan: Anaphylaxis due to food  - skin prick testing is negative.  Will obtain serum IgE levels to these foods and will call with results in about a week when they return.     - continue avoidance of peanuts/tree nuts as well as tomato and wheat/bread products  - have access to self-injectable epinephrine (Epipen or AuviQ) 0.3mg  at all times  - follow emergency action plan in case of allergic reaction  Allergic rhinitis with conjunctivitis  - environmental allergy skin testing today is positive to positive to dust mites, grass  pollen, weed pollen, tree pollen, dust mite, molds   - allergen avoidance measures discussed/handouts provided  - continue Zyrtec 10mg  daily as needed for general allergy symptoms  - for itchy/watery/red eyes use Olopatadine 0.1% 1 drop each eye twice a day as needed  - consider allergen immunotherapy (allergy shots) if medication management is not effective.    Eczema  - daily moisturization with emollients like Eucerin, CeraVe, Cetaphil, Aveeno, Aquafor, Vaseline  - use mometasone topically apply thin layer daily as needed for itchy/dry/patchy/red eczema areas  Follow-up 6 months or sooner if needed  I appreciate the opportunity to take part in [facility] care. Please do not hesitate to contact me with questions.  Sincerely,   Margo Aye, MD Allergy/Immunology [facility]

## 2018-12-26 NOTE — Patient Instructions (Addendum)
Food allergy   - skin prick testing is negative.  Will obtain serum IgE levels to these foods and will call with results in about a week when they return.     - continue avoidance of peanuts/tree nuts as well as tomato and wheat/bread products  - have access to self-injectable epinephrine (Epipen or AuviQ) 0.3mg  at all times  - follow emergency action plan in case of allergic reaction  Environmental allergy  - environmental allergy skin testing today is positive to positive to dust mites, grass pollen, weed pollen, tree pollen, dust mite, molds   - allergen avoidance measures discussed/handouts provided  - continue Zyrtec 10mg  daily as needed for general allergy symptoms  - for itchy/watery/red eyes use Olopatadine 0.1% 1 drop each eye twice a day as needed  - consider allergen immunotherapy (allergy shots) if medication management is not effective.    Eczema  - daily moisturization with emollients like Eucerin, CeraVe, Cetaphil, Aveeno, Aquafor, Vaseline  - use mometasone topically apply thin layer daily as needed for itchy/dry/patchy/red eczema areas  Follow-up 6 months or sooner if needed

## 2018-12-29 LAB — ALLERGEN PROFILE, SHELLFISH
Clam IgE: <0.1 kU/L
F023-IgE Crab: 0.11 kU/L — AB
F290-IgE Oyster: <0.1 kU/L
Scallop IgE: <0.1 kU/L
Shrimp IgE: 1 kU/L — AB

## 2018-12-29 LAB — ALLERGEN PROFILE, FOOD-MEAT
Beef IgE: <0.1 kU/L
Chicken IgE: <0.1 kU/L
Pork IgE: <0.1 kU/L

## 2018-12-29 LAB — ALLERGEN PROFILE, FOOD-FRUIT
Allergen Apple, IgE: <0.1 kU/L
Allergen Grape IgE: <0.1 kU/L

## 2018-12-29 LAB — ALLERGEN CHOCOLATE: Chocolate/Cacao IgE: <0.1 kU/L

## 2018-12-29 LAB — ALLERGEN SESAME F10

## 2018-12-29 LAB — ALLERGENS(7)
F020-IgE Almond: <0.1 kU/L
Peanut IgE: <0.1 kU/L

## 2018-12-29 LAB — ALLERGEN EGG WHITE F1: Egg White IgE: <0.1 kU/L

## 2018-12-29 LAB — ALLERGEN PROFILE, FOOD-FISH
Allergen Mackerel IgE: <0.1 kU/L
Allergen Trout IgE: <0.1 kU/L
Halibut IgE: <0.1 kU/L

## 2018-12-29 LAB — ALLERGEN, WHEAT, F4: Wheat IgE: <0.1 kU/L

## 2018-12-29 LAB — ALLERGEN PROFILE, VEGETABLE II
Allergen Green Bean IgE: <0.1 kU/L
Allergen Onion IgE: <0.1 kU/L
Allergen Potato, White IgE: <0.1 kU/L
Soybean IgE: <0.1 kU/L

## 2018-12-29 LAB — ALLERGEN MILK

## 2018-12-29 LAB — ALLERGEN, MUSHROOM, RF212: Mushroom IgE: <0.1 kU/L

## 2018-12-29 LAB — ALLERGEN AVOCADO F96: F096-IgE Avocado: <0.1 kU/L

## 2018-12-29 LAB — ALLERGEN, CORN F8

## 2019-01-20 ENCOUNTER — Ambulatory Visit
Admission: RE | Admit: 2019-01-20 | Discharge: 2019-01-20 | Disposition: A | Discharge status: Home / Self Care | Payer: 59 | Source: Ambulatory Visit | Attending: Obstetrics and Gynecology | Admitting: Obstetrics and Gynecology

## 2019-01-20 ENCOUNTER — Other Ambulatory Visit: Payer: Self-pay

## 2019-01-20 ENCOUNTER — Other Ambulatory Visit: Payer: Self-pay | Admitting: Obstetrics and Gynecology

## 2019-01-20 DIAGNOSIS — N6310 Unspecified lump in the right breast, unspecified quadrant: Secondary | ICD-10-CM

## 2019-07-21 ENCOUNTER — Other Ambulatory Visit: Payer: 59

## 2020-10-23 ENCOUNTER — Other Ambulatory Visit: Payer: Self-pay | Admitting: Obstetrics and Gynecology

## 2020-10-23 DIAGNOSIS — R928 Other abnormal and inconclusive findings on diagnostic imaging of breast: Secondary | ICD-10-CM

## 2020-11-10 ENCOUNTER — Ambulatory Visit
Admission: RE | Admit: 2020-11-10 | Discharge: 2020-11-10 | Disposition: A | Discharge status: Home / Self Care | Payer: 59 | Source: Ambulatory Visit | Attending: Obstetrics and Gynecology | Admitting: Obstetrics and Gynecology

## 2020-11-10 DIAGNOSIS — R928 Other abnormal and inconclusive findings on diagnostic imaging of breast: Secondary | ICD-10-CM

## 2021-04-15 ENCOUNTER — Other Ambulatory Visit: Payer: Self-pay | Admitting: Obstetrics and Gynecology

## 2021-04-15 DIAGNOSIS — R58 Hemorrhage, not elsewhere classified: Secondary | ICD-10-CM

## 2021-04-18 ENCOUNTER — Other Ambulatory Visit: Payer: Self-pay | Admitting: Obstetrics and Gynecology

## 2021-04-18 DIAGNOSIS — R109 Unspecified abdominal pain: Secondary | ICD-10-CM

## 2021-04-18 DIAGNOSIS — N939 Abnormal uterine and vaginal bleeding, unspecified: Secondary | ICD-10-CM

## 2021-04-20 ENCOUNTER — Ambulatory Visit
Admission: RE | Admit: 2021-04-20 | Discharge: 2021-04-20 | Disposition: A | Discharge status: Home / Self Care | Payer: 59 | Source: Ambulatory Visit | Attending: Obstetrics and Gynecology | Admitting: Obstetrics and Gynecology

## 2021-04-20 ENCOUNTER — Other Ambulatory Visit: Payer: Self-pay

## 2021-04-20 DIAGNOSIS — N939 Abnormal uterine and vaginal bleeding, unspecified: Secondary | ICD-10-CM

## 2021-04-20 DIAGNOSIS — R109 Unspecified abdominal pain: Secondary | ICD-10-CM

## 2021-06-06 IMAGING — MG DIGITAL DIAGNOSTIC BILAT W/ TOMO W/ CAD
8 series · 8 of 24 positions shown · non-contrast
Comparison: Previous exam(s).

CLINICAL DATA: 44-year-old female presenting for annual exam as
well as follow-up of probably benign right breast masses and
asymmetry.

EXAM:
DIGITAL DIAGNOSTIC BILATERAL MAMMOGRAM WITH TOMOSYNTHESIS AND CAD;
ULTRASOUND RIGHT BREAST LIMITED
TECHNIQUE: Bilateral digital diagnostic mammography and breast tomosynthesis
was performed. The images were evaluated with computer-aided
detection.; Targeted ultrasound examination of the right breast was
performed.

[L CC synth-2D]
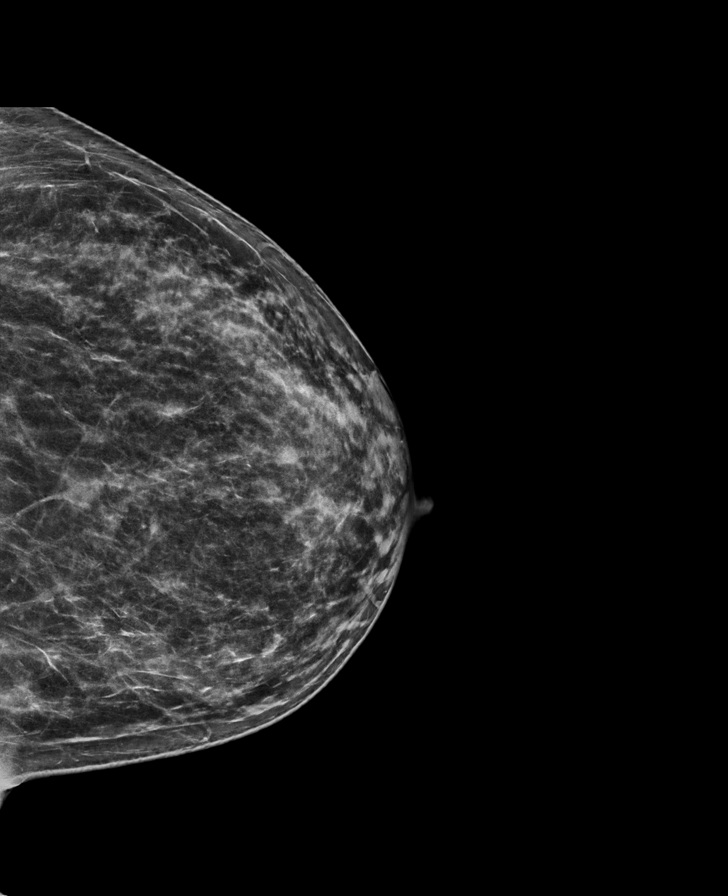

[R MLO synth-2D]
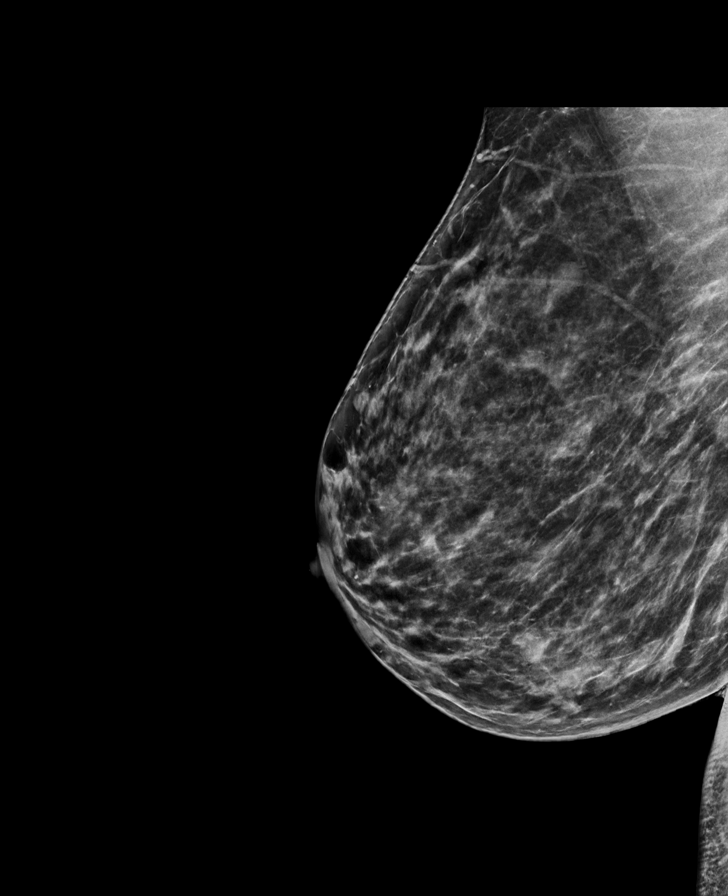

[L MLO synth-2D]
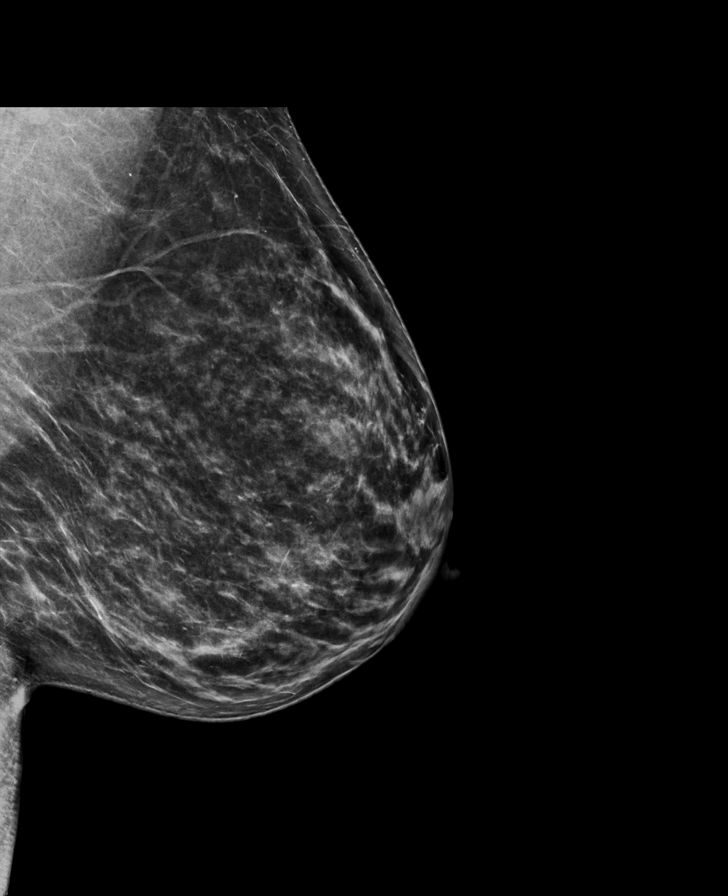

[R CC synth-2D]
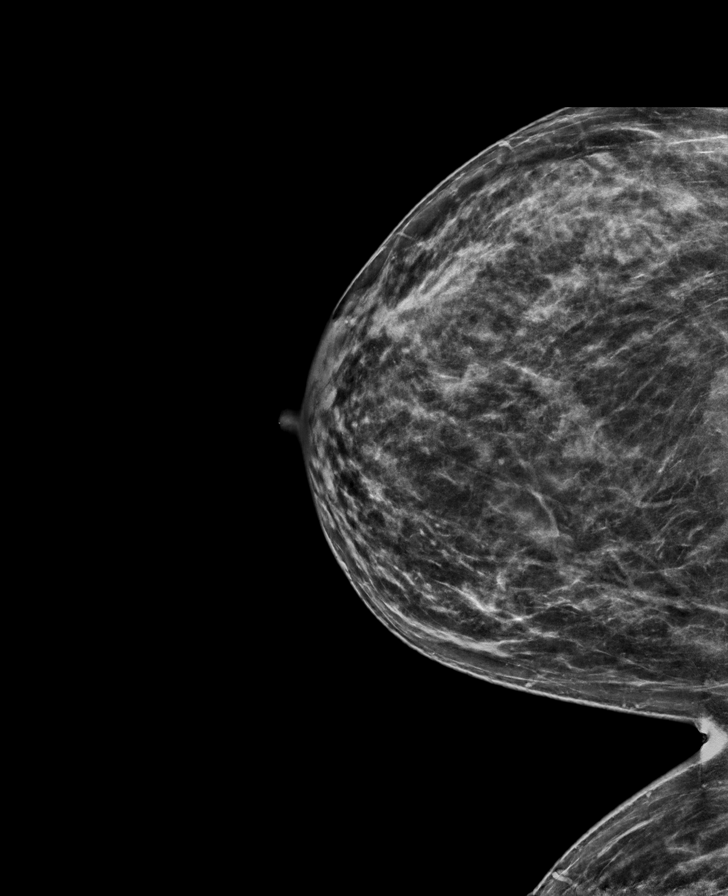

[R MLO tomo · tomo slice 39/76.0]
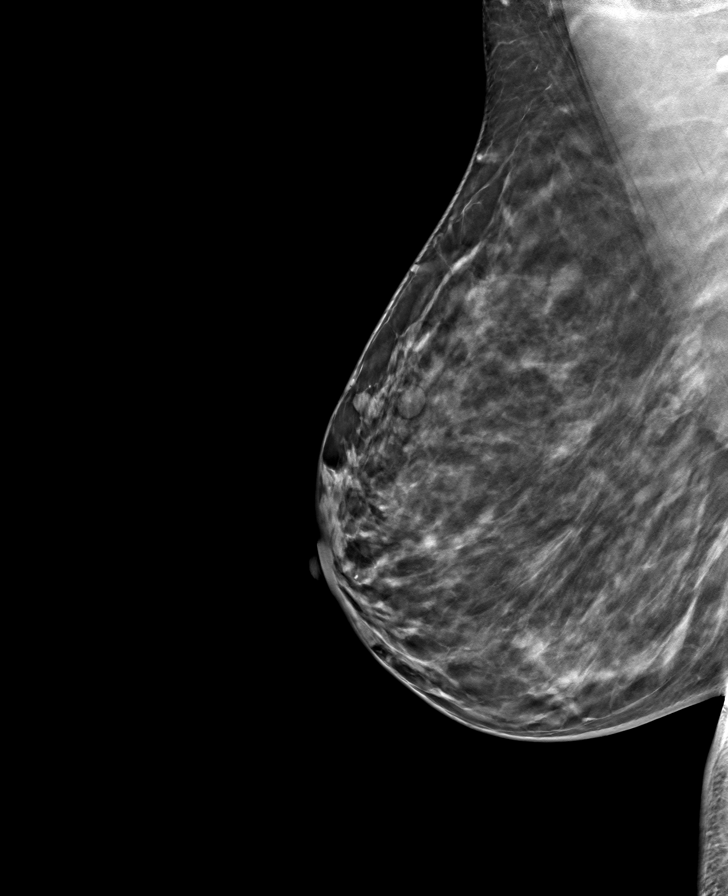

[L CC tomo · tomo slice 37/72.0]
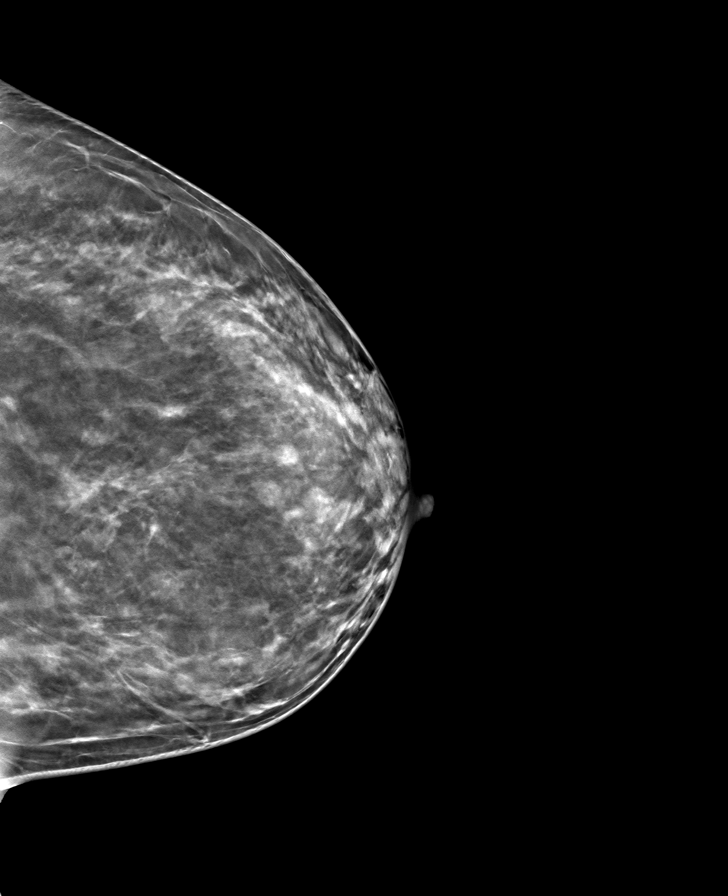

[R CC tomo · tomo slice 36/71.0]
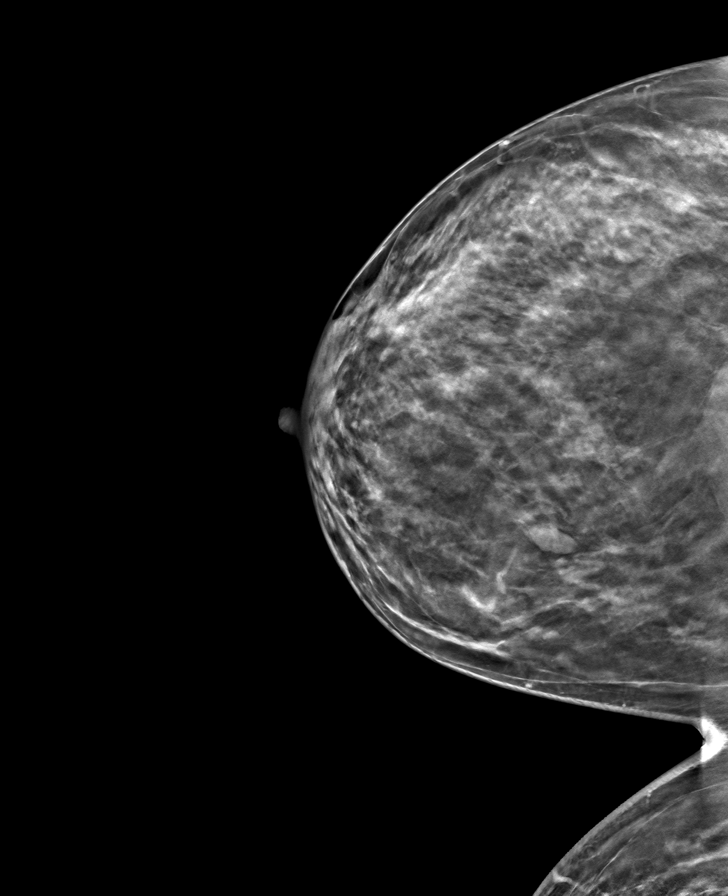

[L MLO tomo · tomo slice 41/82.0]
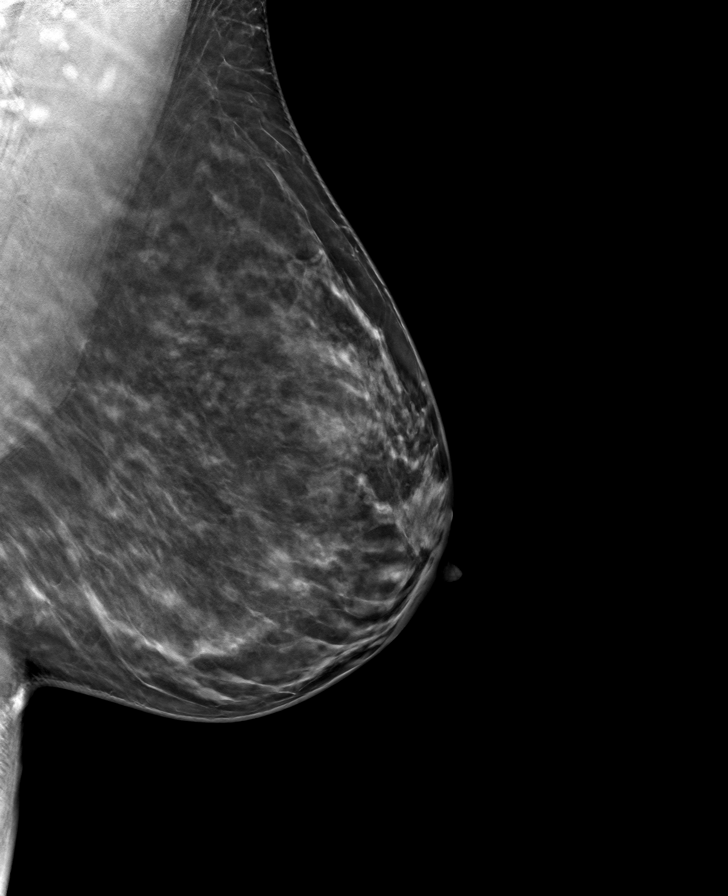

[8 of 24 positions shown; findings below may reference images not displayed]

ACR Breast Density Category c: The breast tissue is heterogeneously
dense, which may obscure small masses.
FINDINGS: Mammogram:

There are multiple bilateral fluctuating oval and round
circumscribed masses.

The two previously described masses in the upper inner and superior
central right breast are stable. The previously described asymmetry
in the posteromedial aspect of the breast is less conspicuous on
today's study.

No suspicious mass, distortion, or microcalcifications are
identified to suggest presence of malignancy bilaterally.

Ultrasound:

Targeted ultrasound is performed in the right breast at 12 o'clock 2
cm from the nipple demonstrating a superficial oval circumscribed
hypoechoic mass measuring 0.8 x 0.5 x 0.6 cm, previously measuring
0.7 x 0.6 x 0.6 cm. At 1:30 o'clock 5 cm from the nipple there is an
oval circumscribed hypoechoic mass measuring 1.6 by 0.5 x 0.7 cm,
previously measuring 1.3 x 0.5 x 0.7 cm.
IMPRESSION: 1. Stable masses in the right breast at 12 o'clock and 1:30 o'clock,
likely complicated cysts. Given stability for over 2 years these are
considered benign.

2. Decreased conspicuity of the previously seen asymmetry in the
posteromedial right breast.

3.  No mammographic evidence of malignancy in the left breast.

RECOMMENDATION:
Screening mammogram in one year.(Code:12-J-GSB)

I have discussed the findings and recommendations with the patient.
If applicable, a reminder letter will be sent to the patient
regarding the next appointment.

BI-RADS CATEGORY  2: Benign.

## 2021-11-14 IMAGING — US US PELVIS COMPLETE WITH TRANSVAGINAL
2 series · 13 of 25 positions shown · non-contrast
Comparison: None

CLINICAL DATA: Dysfunctional uterine bleeding, left-sided abdominal
pain

EXAM:
TRANSABDOMINAL AND TRANSVAGINAL ULTRASOUND OF PELVIS
TECHNIQUE: Both transabdominal and transvaginal ultrasound examinations of the
pelvis were performed. Transabdominal technique was performed for
global imaging of the pelvis including uterus, ovaries, adnexal
regions, and pelvic cul-de-sac. It was necessary to proceed with
endovaginal exam following the transabdominal exam to visualize the
cervix, endometrium, and left pelvic mass.

[Series 1: us pelvis complete with transvaginal · 0.19mm/px · 12 of 87 slices shown (1 of 2)]
[im 1/87]
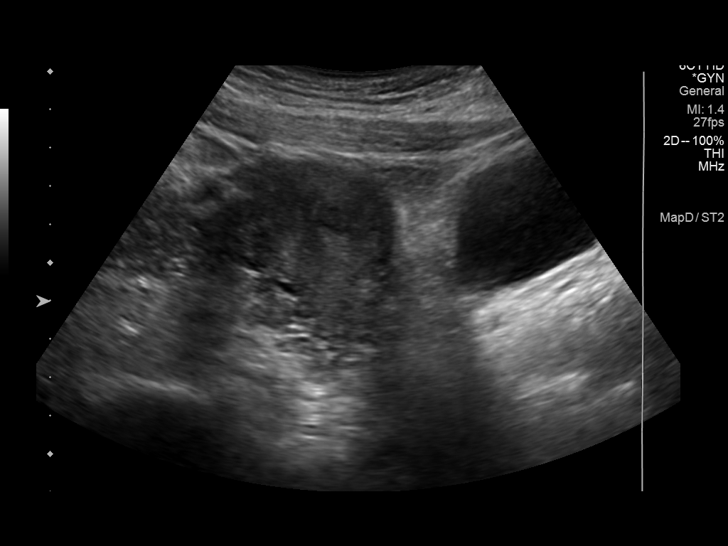
[im 8/87]
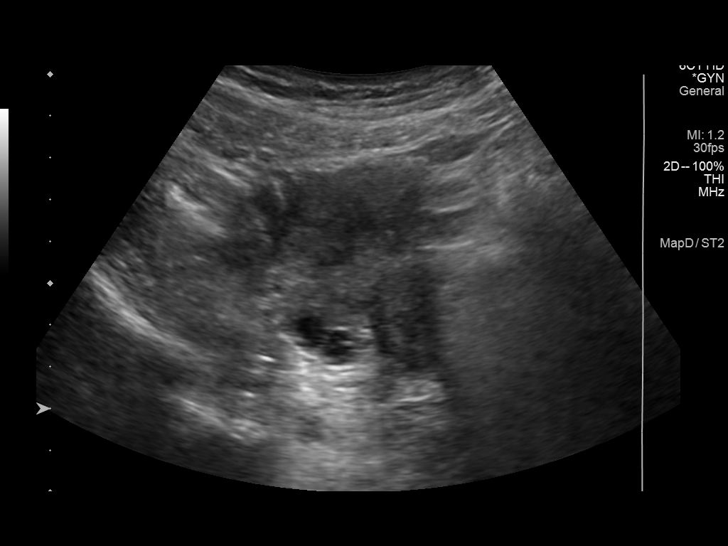
[im 15/87]
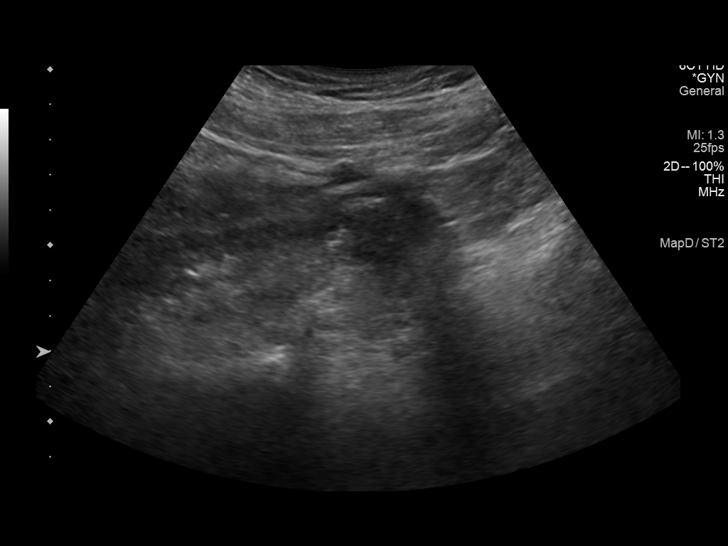
[im 23/87]
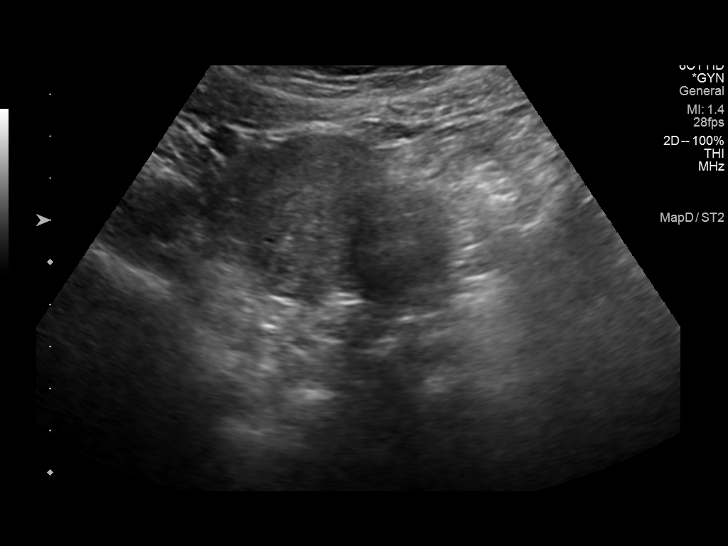
[im 30/87]
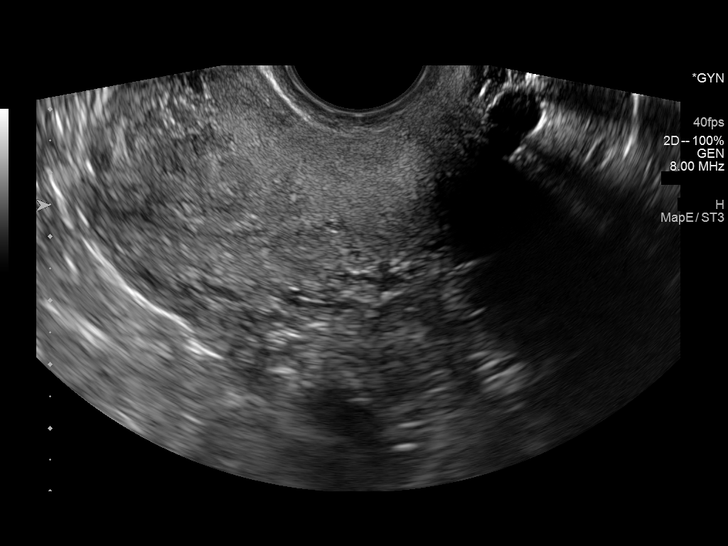
[im 38/87]
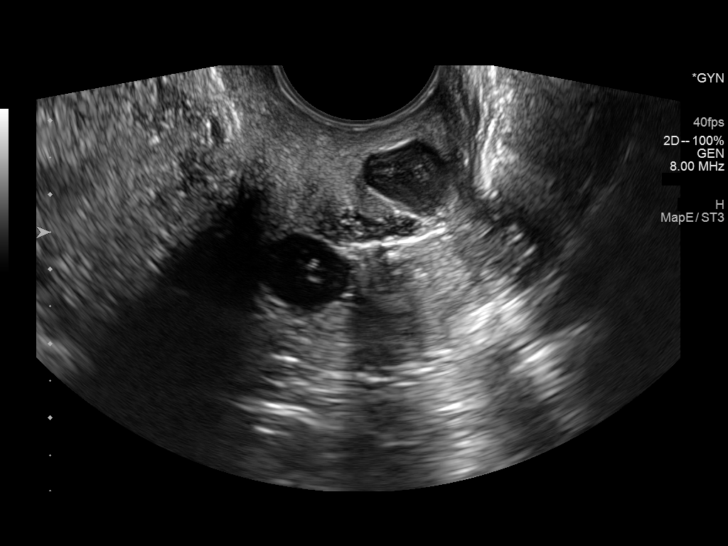
[im 45/87]
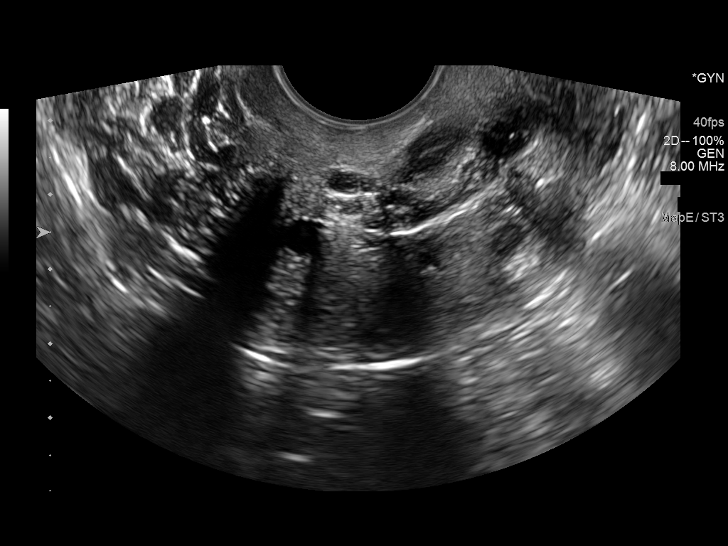
[im 53/87]
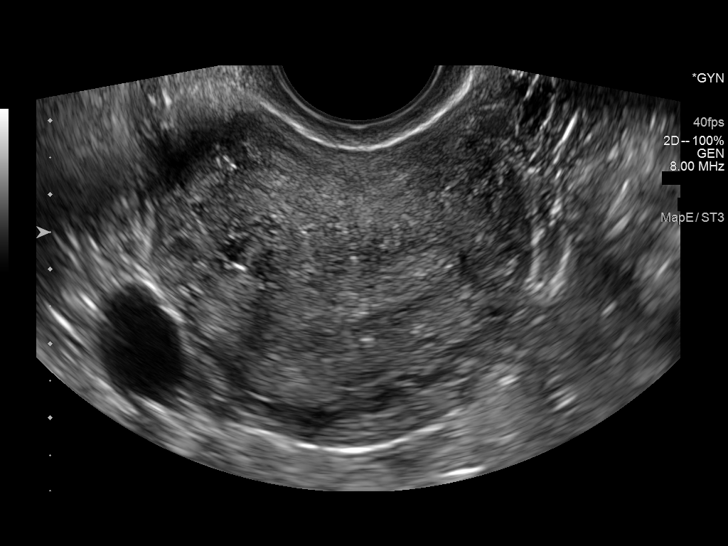
[im 60/87]
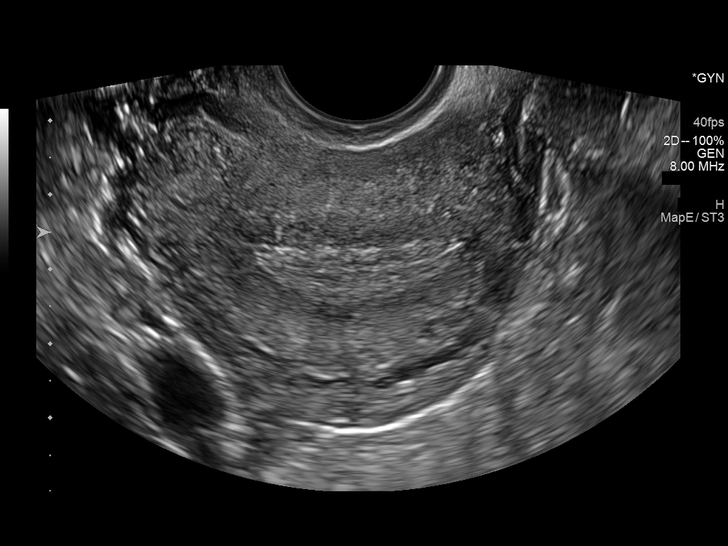
[im 68/87]
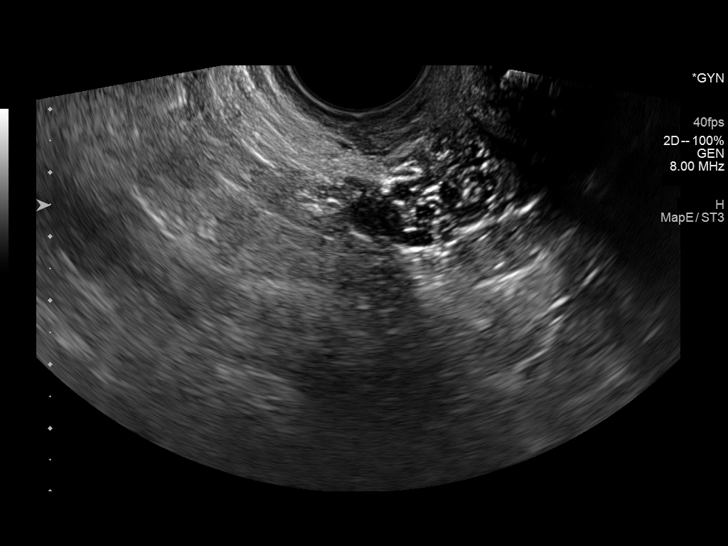
[im 75/87]
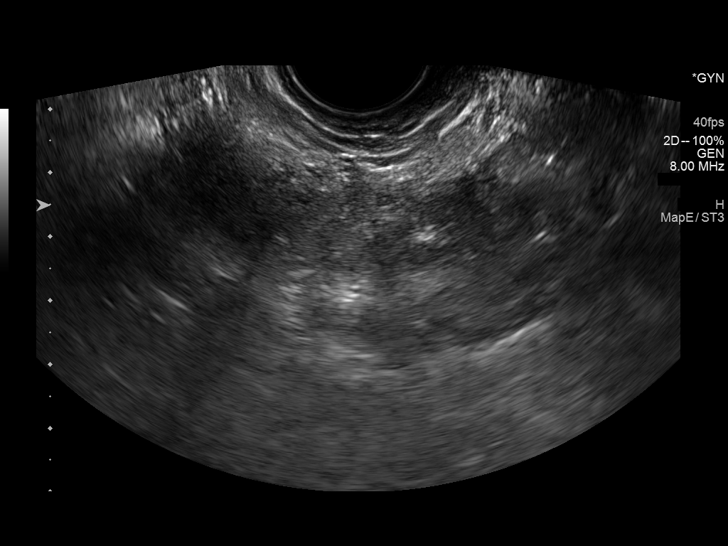
[im 83/87]
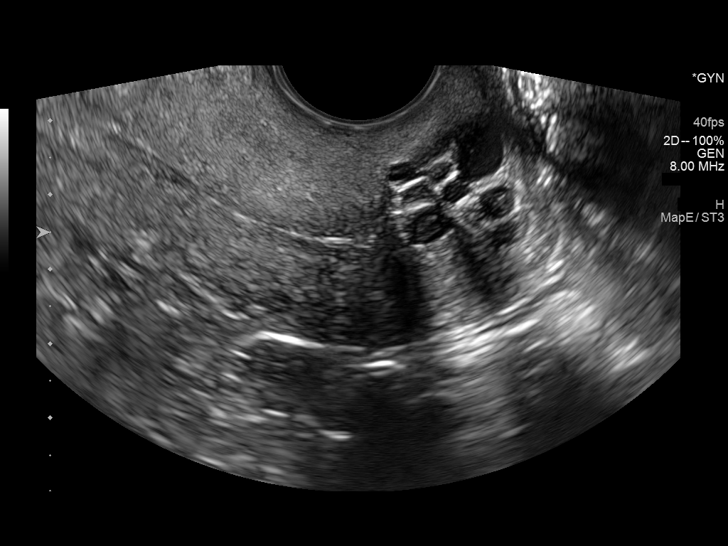

[Series 1001: us pelvis complete with transvaginal · 2 acquisitions, 1 frame shown (2 of 2)]
[im 1/2]
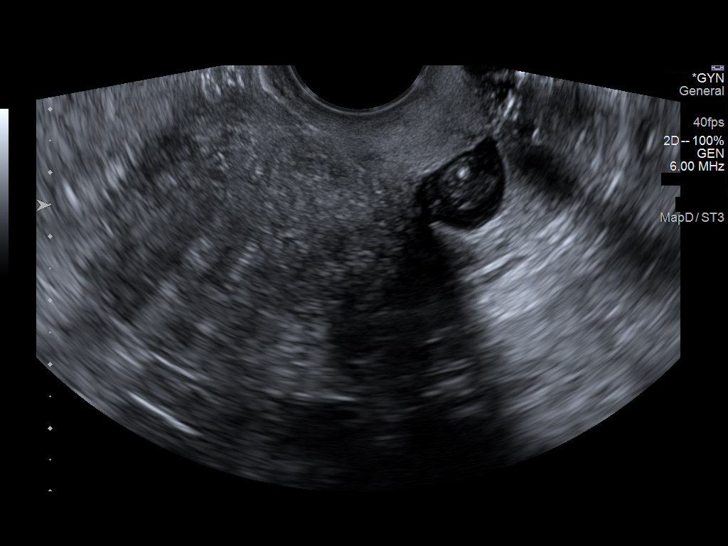

[13 of 25 positions shown; findings below may reference images not displayed]

FINDINGS: Uterus

Measurements: 8.3 x 4.5 x 4.9 cm = volume: 96 mL. A heterogeneously
hypoechoic solid mass is seen within the intramural/subserosal right
uterine fundus measuring roughly 1.8 x 2.4 x 3.0 cm most in keeping
with an intrauterine fibroid. There is, additionally, a pedunculated
heterogeneously hypoechoic mass arising from the left lower uterine
segment which is separate from the cervix measuring 3.6 x 3.1 x
cm in size most compatible with a pedunculated uterine fibroid.
There is preserved internal vascularity within this mass. There are
numerous cystic lesion seen within the cervixq, particularly
anteriorly, best seen on image # 84, which distort the endocervix.
No definite solid component, however, is identified and this does
not appear to expand the cervix or extend through the fibromuscular
stroma. This most likely represents multiple nabothian cysts.

Endometrium

Thickness: 7 mm.  No focal abnormality visualized.

Right ovary

Measurements: 2.7 x 2.3 x 1.6 cm = volume: 5 mL. Normal
appearance/no adnexal mass.

Left ovary

Measurements: 3.4 x 1.6 x 2.3 cm = volume: 6 mL. Normal
appearance/no adnexal mass.

Other findings

No abnormal free fluid.
IMPRESSION: Multiple uterine fibroids, the largest of which is pedunculated and
arises from the left lower uterine segment.

Multiple cystic lesions within the cervix most in keeping with
multiple nabothian cysts. Correlation with direct visualization may
be helpful as the dominant component appears anteriorly in the
region of the external cervical os.

## 2022-09-08 ENCOUNTER — Ambulatory Visit
Admission: EM | Admit: 2022-09-08 | Discharge: 2022-09-08 | Disposition: A | Discharge status: Home / Self Care | Payer: Commercial Managed Care - HMO | Attending: Internal Medicine | Admitting: Internal Medicine

## 2022-09-08 DIAGNOSIS — R350 Frequency of micturition: Secondary | ICD-10-CM | POA: Diagnosis present

## 2022-09-08 LAB — POCT URINALYSIS DIP (MANUAL ENTRY)
Bilirubin, UA: NEGATIVE
Blood, UA: NEGATIVE
Glucose, UA: NEGATIVE mg/dL
Ketones, POC UA: NEGATIVE mg/dL
Leukocytes, UA: NEGATIVE
Nitrite, UA: NEGATIVE
Protein Ur, POC: NEGATIVE mg/dL
Spec Grav, UA: 1.02 (ref 1.010–1.025)
Urobilinogen, UA: 0.2 E.U./dL
pH, UA: 7 (ref 5.0–8.0)

## 2022-09-08 LAB — POCT URINE PREGNANCY: Preg Test, Ur: NEGATIVE

## 2022-09-08 MED ORDER — NITROFURANTOIN MONOHYD MACRO 100 MG PO CAPS: 100.0000 mg | ORAL_CAPSULE | Freq: Two times a day (BID) | ORAL | 0 refills | Status: AC

## 2022-09-08 NOTE — ED Triage Notes (Signed)
Pt c/o lower back pain, left flank pain, frequency, burning sensation while voiding,   Denies discharge or odor   Onset ~ 3 days ago

## 2022-09-08 NOTE — ED Provider Notes (Signed)
[facility]    CSN: [id number] Arrival date & time: 09/08/22  1021      History   Chief Complaint Chief Complaint  Patient presents with   Urinary Tract Infection    HPI Sarah Stephens is a 46 y.o. female.   Patient here today for evaluation of mild lower back pain and left flank pain that started about 3 days ago with associated urinary frequency and burning.  She has not had any discharge or odor.  She denies any fever.  She has not had any nausea or vomiting.  She does not report treatment for symptoms.  The history is provided by the patient.  Urinary Tract Infection Associated symptoms: flank pain   Associated symptoms: no abdominal pain, no fever, no nausea and no vomiting     Past Medical History:  Diagnosis Date   Kidney stones    Urinary bladder ulcer 2008    Patient Active Problem List   Diagnosis Date Noted   LUQ pain 02/17/2014   IBS (irritable bowel syndrome) 01/31/2014   GAD (generalized anxiety disorder) 01/31/2014   BMI 31.0-31.9,adult 01/31/2014   Esophageal reflux 01/20/2014   Dysphagia, unspecified(787.20) 01/20/2014    Past Surgical History:  Procedure Laterality Date   ANKLE SURGERY Left    bladder stem stretched     x 2   lithotripy     for kidney stones   OVARIAN CYST REMOVAL      OB History   No obstetric history on file.      Home Medications    Prior to Admission medications   Medication Sig Start Date End Date Taking? Authorizing Provider  nitrofurantoin, macrocrystal-monohydrate, (MACROBID) 100 MG capsule Take 1 capsule (100 mg total) by mouth 2 (two) times daily. 09/08/22  Yes Tomi Bamberger, PA-C  EPINEPHrine (AUVI-Q) 0.3 mg/0.3 mL IJ SOAJ injection Inject 0.3 mLs (0.3 mg total) into the muscle as needed for anaphylaxis. 12/26/18   Marcelyn Bruins, MD  EPINEPHrine 0.3 mg/0.3 mL IJ SOAJ injection Inject 0.3 mLs (0.3 mg total) into the muscle once. Patient not taking: Reported on 12/26/2018 07/13/15    Morrell Riddle, PA-C  olopatadine (PATANOL) 0.1 % ophthalmic solution Place 1 drop into both eyes 2 (two) times daily. 12/26/18   Marcelyn Bruins, MD    Family History Family History  Problem Relation Age of Onset   Diabetes Maternal Grandmother    Heart disease Maternal Grandmother    Allergic rhinitis Son    Asthma Son    Asthma Daughter    Colon cancer Neg Hx     Social History Social History   Tobacco Use   Smoking status: Never   Smokeless tobacco: Never  Vaping Use   Vaping Use: Never used  Substance Use Topics   Alcohol use: No   Drug use: No     Allergies   Peanut-containing drug products, Latex, and Zithromax [azithromycin]   Review of Systems Review of Systems  Constitutional:  Negative for chills and fever.  Eyes:  Negative for discharge and redness.  Respiratory:  Negative for shortness of breath.   Gastrointestinal:  Negative for abdominal pain, nausea and vomiting.  Genitourinary:  Positive for dysuria, flank pain and frequency.  Musculoskeletal:  Positive for back pain.     Physical Exam Triage Vital Signs ED Triage Vitals  Enc Vitals Group     BP 09/08/22 1130 (!) 137/91     Pulse Rate 09/08/22 1128 68  Resp 09/08/22 1128 16     Temp 09/08/22 1128 98 F (36.7 C)     Temp Source 09/08/22 1128 Oral     SpO2 09/08/22 1128 98 %     Weight --      Height --      Head Circumference --      Peak Flow --      Pain Score 09/08/22 1128 2     Pain Loc --      Pain Edu? --      Excl. in GC? --    No data found.  Updated Vital Signs BP (!) 137/91 (BP Location: Right Arm)   Pulse 68   Temp 98 F (36.7 C) (Oral)   Resp 16   SpO2 98%      Physical Exam Vitals and nursing note reviewed.  Constitutional:      General: She is not in acute distress.    Appearance: Normal appearance. She is not ill-appearing.  HENT:     Head: Normocephalic and atraumatic.     Nose: Nose normal. No congestion.  Eyes:     Conjunctiva/sclera:  Conjunctivae normal.  Cardiovascular:     Rate and Rhythm: Normal rate.  Pulmonary:     Effort: Pulmonary effort is normal. No respiratory distress.  Neurological:     Mental Status: She is alert.  Psychiatric:        Mood and Affect: Mood normal.        Behavior: Behavior normal.      UC Treatments / Results  Labs (all labs ordered are listed, but only abnormal results are displayed) Labs Reviewed  URINE CULTURE  POCT URINALYSIS DIP (MANUAL ENTRY)  POCT URINE PREGNANCY    EKG   Radiology No results found.  Procedures Procedures (including critical care time)  Medications Ordered in UC Medications - No data to display  Initial Impression / Assessment and Plan / UC Course  I have reviewed the triage vital signs and the nursing notes.  Pertinent labs & imaging results that were available during my care of the patient were reviewed by me and considered in my medical decision making (see chart for details).    Given patient's history we will treat to cover UTI with Macrobid.  Urine culture ordered.  Will await results for further recommendation.  Encouraged follow-up with any worsening symptoms or further concerns.  Patient expresses understanding.  Final Clinical Impressions(s) / UC Diagnoses   Final diagnoses:  Urinary frequency   Discharge Instructions   None    ED Prescriptions     Medication Sig Dispense Auth. Provider   nitrofurantoin, macrocrystal-monohydrate, (MACROBID) 100 MG capsule Take 1 capsule (100 mg total) by mouth 2 (two) times daily. 10 capsule Tomi Bamberger, PA-C      PDMP not reviewed this encounter.   Tomi Bamberger, PA-C 09/08/22 1345

## 2022-09-11 ENCOUNTER — Telehealth (HOSPITAL_COMMUNITY): Payer: Self-pay | Admitting: Emergency Medicine

## 2022-09-11 LAB — URINE CULTURE: Culture: 80000 — AB

## 2022-09-11 MED ORDER — CEPHALEXIN 500 MG PO CAPS: 500.0000 mg | ORAL_CAPSULE | Freq: Two times a day (BID) | ORAL | 0 refills | Status: AC

## 2022-09-19 ENCOUNTER — Telehealth: Payer: Self-pay | Admitting: Physician Assistant

## 2022-09-19 MED ORDER — CIPROFLOXACIN HCL 500 MG PO TABS: 500.0000 mg | ORAL_TABLET | Freq: Two times a day (BID) | ORAL | 0 refills | Status: AC

## 2022-09-19 NOTE — Telephone Encounter (Signed)
Patient called- still not completely improved- requesting additional antibiotics. Per culture Cipro would be a good option for treatment- same sent to pharmacy. Patient notified. Encouraged follow up with persistent symptoms or evaluation in the ED with any worsening given duration of symptoms. Patient expresses understanding.
